# Patient Record
Sex: Male | Born: 1950 | Race: Black or African American | Hispanic: No | Marital: Single | Smoking: Former smoker
Health system: Southern US, Community
[De-identification: ages and names within clinical notes are randomized; demographics above are authoritative.]

## PROBLEM LIST (undated history)

## (undated) DIAGNOSIS — C801 Malignant (primary) neoplasm, unspecified: Secondary | ICD-10-CM

## (undated) DIAGNOSIS — R1310 Dysphagia, unspecified: Secondary | ICD-10-CM

## (undated) DIAGNOSIS — E079 Disorder of thyroid, unspecified: Secondary | ICD-10-CM

## (undated) DIAGNOSIS — E876 Hypokalemia: Secondary | ICD-10-CM

## (undated) DIAGNOSIS — I10 Essential (primary) hypertension: Secondary | ICD-10-CM

## (undated) DIAGNOSIS — K219 Gastro-esophageal reflux disease without esophagitis: Secondary | ICD-10-CM

## (undated) DIAGNOSIS — Z930 Tracheostomy status: Secondary | ICD-10-CM

## (undated) DIAGNOSIS — E871 Hypo-osmolality and hyponatremia: Secondary | ICD-10-CM

## (undated) HISTORY — PX: LEG SURGERY: SHX1003

## (undated) HISTORY — PX: TRACHEOSTOMY: SUR1362

## (undated) HISTORY — PX: THROAT SURGERY: SHX803

---

## 2006-04-08 ENCOUNTER — Ambulatory Visit: Payer: Self-pay | Admitting: Family Medicine

## 2007-04-21 ENCOUNTER — Emergency Department (HOSPITAL_COMMUNITY): Admission: EM | Admit: 2007-04-21 | Discharge: 2007-04-21 | Payer: Self-pay | Admitting: Emergency Medicine

## 2007-05-01 ENCOUNTER — Emergency Department (HOSPITAL_COMMUNITY): Admission: EM | Admit: 2007-05-01 | Discharge: 2007-05-01 | Payer: Self-pay | Admitting: Emergency Medicine

## 2010-04-28 ENCOUNTER — Emergency Department (HOSPITAL_COMMUNITY)
Admission: EM | Admit: 2010-04-28 | Discharge: 2010-04-28 | Payer: Self-pay | Source: Home / Self Care | Admitting: Emergency Medicine

## 2010-05-02 ENCOUNTER — Emergency Department (HOSPITAL_COMMUNITY)
Admission: EM | Admit: 2010-05-02 | Discharge: 2010-05-02 | Payer: Self-pay | Source: Home / Self Care | Admitting: Emergency Medicine

## 2010-07-23 LAB — COMPREHENSIVE METABOLIC PANEL
ALT: 24 U/L (ref 0–53)
Alkaline Phosphatase: 64 U/L (ref 39–117)
CO2: 22 mEq/L (ref 19–32)
Calcium: 8.9 mg/dL (ref 8.4–10.5)
GFR calc non Af Amer: >60 mL/min (ref >60)
Glucose, Bld: 89 mg/dL (ref 70–99)
Potassium: 3.3 mEq/L — ABNORMAL LOW (ref 3.5–5.1)
Sodium: 126 mEq/L — ABNORMAL LOW (ref 135–145)
Total Bilirubin: 1 mg/dL (ref 0.3–1.2)

## 2010-07-23 LAB — RAPID STREP SCREEN (MED CTR MEBANE ONLY): Streptococcus, Group A Screen (Direct): NEGATIVE

## 2010-07-23 LAB — CBC
HCT: 34.9 % — ABNORMAL LOW (ref 39.0–52.0)
Hemoglobin: 13.4 g/dL (ref 13.0–17.0)
MCHC: 38.4 g/dL — ABNORMAL HIGH (ref 30.0–36.0)

## 2010-07-23 LAB — DIFFERENTIAL
Basophils Absolute: 0 10*3/uL (ref 0.0–0.1)
Basophils Relative: 1 % (ref 0–1)
Eosinophils Absolute: 0.2 10*3/uL (ref 0.0–0.7)
Neutrophils Relative %: 55 % (ref 43–77)

## 2011-04-18 ENCOUNTER — Encounter: Payer: Self-pay | Admitting: *Deleted

## 2011-04-18 ENCOUNTER — Inpatient Hospital Stay (HOSPITAL_COMMUNITY)
Admission: EM | Admit: 2011-04-18 | Discharge: 2011-04-20 | DRG: 194 | Disposition: A | Discharge status: Nursing Home (Includes ICF) | Payer: Medicaid Other | Attending: Internal Medicine | Admitting: Internal Medicine

## 2011-04-18 ENCOUNTER — Other Ambulatory Visit: Payer: Self-pay

## 2011-04-18 ENCOUNTER — Emergency Department (HOSPITAL_COMMUNITY): Payer: Medicaid Other

## 2011-04-18 DIAGNOSIS — E871 Hypo-osmolality and hyponatremia: Secondary | ICD-10-CM | POA: Diagnosis present

## 2011-04-18 DIAGNOSIS — Z9089 Acquired absence of other organs: Secondary | ICD-10-CM

## 2011-04-18 DIAGNOSIS — I10 Essential (primary) hypertension: Secondary | ICD-10-CM | POA: Insufficient documentation

## 2011-04-18 DIAGNOSIS — J189 Pneumonia, unspecified organism: Principal | ICD-10-CM | POA: Diagnosis present

## 2011-04-18 DIAGNOSIS — R6883 Chills (without fever): Secondary | ICD-10-CM | POA: Diagnosis present

## 2011-04-18 DIAGNOSIS — Z9002 Acquired absence of larynx: Secondary | ICD-10-CM

## 2011-04-18 DIAGNOSIS — Z8521 Personal history of malignant neoplasm of larynx: Secondary | ICD-10-CM

## 2011-04-18 DIAGNOSIS — E876 Hypokalemia: Secondary | ICD-10-CM | POA: Diagnosis present

## 2011-04-18 DIAGNOSIS — K219 Gastro-esophageal reflux disease without esophagitis: Secondary | ICD-10-CM | POA: Diagnosis present

## 2011-04-18 DIAGNOSIS — C801 Malignant (primary) neoplasm, unspecified: Secondary | ICD-10-CM | POA: Insufficient documentation

## 2011-04-18 HISTORY — DX: Essential (primary) hypertension: I10

## 2011-04-18 HISTORY — DX: Malignant (primary) neoplasm, unspecified: C80.1

## 2011-04-18 HISTORY — DX: Gastro-esophageal reflux disease without esophagitis: K21.9

## 2011-04-18 LAB — URINALYSIS, ROUTINE W REFLEX MICROSCOPIC
Ketones, ur: NEGATIVE mg/dL
Leukocytes, UA: NEGATIVE
Nitrite: NEGATIVE
Specific Gravity, Urine: 1.02 (ref 1.005–1.030)
Urobilinogen, UA: 0.2 mg/dL (ref 0.0–1.0)
pH: 6 (ref 5.0–8.0)

## 2011-04-18 LAB — BASIC METABOLIC PANEL
CO2: 26 mEq/L (ref 19–32)
Calcium: 8.9 mg/dL (ref 8.4–10.5)
Chloride: 89 mEq/L — ABNORMAL LOW (ref 96–112)
GFR calc Af Amer: >90 mL/min (ref >90)
Sodium: 125 mEq/L — ABNORMAL LOW (ref 135–145)

## 2011-04-18 LAB — CBC
Platelets: 393 10*3/uL (ref 150–400)
RBC: 3.85 MIL/uL — ABNORMAL LOW (ref 4.22–5.81)
WBC: 11.4 10*3/uL — ABNORMAL HIGH (ref 4.0–10.5)

## 2011-04-18 LAB — LACTIC ACID, PLASMA: Lactic Acid, Venous: 0.6 mmol/L (ref 0.5–2.2)

## 2011-04-18 MED ORDER — LEVOTHYROXINE SODIUM 100 MCG PO TABS
100.0000 ug | ORAL_TABLET | ORAL | Status: DC
  Administered 2011-04-19: 100 ug via ORAL
  Filled 2011-04-18: qty 1

## 2011-04-18 MED ORDER — LEVOTHYROXINE SODIUM 75 MCG PO TABS
150.0000 ug | ORAL_TABLET | ORAL | Status: DC
  Administered 2011-04-20: 150 ug via ORAL
  Filled 2011-04-18: qty 2

## 2011-04-18 MED ORDER — POTASSIUM CHLORIDE IN NACL 20-0.9 MEQ/L-% IV SOLN
INTRAVENOUS | Status: AC
  Administered 2011-04-18: 23:00:00 via INTRAVENOUS

## 2011-04-18 MED ORDER — MOXIFLOXACIN HCL IN NACL 400 MG/250ML IV SOLN
400.0000 mg | INTRAVENOUS | Status: DC
  Administered 2011-04-19: 400 mg via INTRAVENOUS
  Filled 2011-04-18 (×3): qty 250

## 2011-04-18 MED ORDER — MOXIFLOXACIN HCL IN NACL 400 MG/250ML IV SOLN
400.0000 mg | Freq: Once | INTRAVENOUS | Status: AC
  Administered 2011-04-18: 400 mg via INTRAVENOUS
  Filled 2011-04-18: qty 250

## 2011-04-18 MED ORDER — PANTOPRAZOLE SODIUM 40 MG PO TBEC
40.0000 mg | DELAYED_RELEASE_TABLET | Freq: Every day | ORAL | Status: DC
  Administered 2011-04-19 – 2011-04-20 (×2): 40 mg via ORAL
  Filled 2011-04-18 (×2): qty 1

## 2011-04-18 MED ORDER — POLYETHYLENE GLYCOL 3350 17 GM/SCOOP PO POWD
17.0000 g | Freq: Every day | ORAL | Status: DC
  Administered 2011-04-19: 17 g via ORAL
  Filled 2011-04-18: qty 255

## 2011-04-18 MED ORDER — MELOXICAM 7.5 MG PO TABS
15.0000 mg | ORAL_TABLET | Freq: Every day | ORAL | Status: DC
  Administered 2011-04-19 – 2011-04-20 (×2): 15 mg via ORAL
  Filled 2011-04-18: qty 2
  Filled 2011-04-18: qty 1
  Filled 2011-04-18 (×2): qty 2

## 2011-04-18 MED ORDER — POLYETHYLENE GLYCOL 3350 17 G PO PACK
PACK | ORAL | Status: AC
  Filled 2011-04-18: qty 1

## 2011-04-18 MED ORDER — SODIUM CHLORIDE 0.9 % IV BOLUS (SEPSIS)
500.0000 mL | Freq: Once | INTRAVENOUS | Status: AC
  Administered 2011-04-18: 500 mL via INTRAVENOUS

## 2011-04-18 MED ORDER — MAGNESIUM OXIDE 400 MG PO TABS
400.0000 mg | ORAL_TABLET | Freq: Every day | ORAL | Status: DC
  Administered 2011-04-19 – 2011-04-20 (×2): 400 mg via ORAL
  Filled 2011-04-18 (×2): qty 1

## 2011-04-18 MED ORDER — DEMECLOCYCLINE HCL 150 MG PO TABS
300.0000 mg | ORAL_TABLET | Freq: Three times a day (TID) | ORAL | Status: DC
  Administered 2011-04-18 – 2011-04-20 (×5): 300 mg via ORAL
  Filled 2011-04-18 (×11): qty 2

## 2011-04-18 MED ORDER — FOLIC ACID 1 MG PO TABS
1.0000 mg | ORAL_TABLET | Freq: Every day | ORAL | Status: DC
  Administered 2011-04-19 – 2011-04-20 (×2): 1 mg via ORAL
  Filled 2011-04-18 (×2): qty 1

## 2011-04-18 MED ORDER — VITAMIN C 500 MG PO TABS
1000.0000 mg | ORAL_TABLET | Freq: Two times a day (BID) | ORAL | Status: DC
  Administered 2011-04-18 – 2011-04-20 (×4): 1000 mg via ORAL
  Filled 2011-04-18 (×4): qty 2

## 2011-04-18 MED ORDER — DEMECLOCYCLINE HCL 150 MG PO TABS
ORAL_TABLET | ORAL | Status: AC
  Filled 2011-04-18: qty 2

## 2011-04-18 MED ORDER — FLUDROCORTISONE ACETATE 0.1 MG PO TABS
0.1000 mg | ORAL_TABLET | Freq: Every day | ORAL | Status: DC
  Administered 2011-04-19 – 2011-04-20 (×2): 0.1 mg via ORAL
  Filled 2011-04-18 (×4): qty 1

## 2011-04-18 MED ORDER — FERROUS SULFATE 300 (60 FE) MG/5ML PO SYRP
ORAL_SOLUTION | ORAL | Status: AC
  Filled 2011-04-18: qty 5

## 2011-04-18 MED ORDER — VITAMIN B-1 100 MG PO TABS
50.0000 mg | ORAL_TABLET | Freq: Every day | ORAL | Status: DC
  Administered 2011-04-19 – 2011-04-20 (×2): 50 mg via ORAL
  Filled 2011-04-18 (×2): qty 1

## 2011-04-18 MED ORDER — BISACODYL 5 MG PO TBEC
10.0000 mg | DELAYED_RELEASE_TABLET | Freq: Every day | ORAL | Status: DC
  Administered 2011-04-19 – 2011-04-20 (×2): 10 mg via ORAL
  Filled 2011-04-18 (×2): qty 2

## 2011-04-18 MED ORDER — FERROUS SULFATE 300 (60 FE) MG/5ML PO SYRP
300.0000 mg | ORAL_SOLUTION | Freq: Three times a day (TID) | ORAL | Status: DC
  Administered 2011-04-18 – 2011-04-20 (×5): 300 mg via ORAL
  Filled 2011-04-18 (×11): qty 5

## 2011-04-18 NOTE — ED Provider Notes (Signed)
History     CSN: [id number] Arrival date & time: 04/18/2011  4:06 PM   First MD Initiated Contact with Patient 04/18/11 1610      Chief Complaint  Patient presents with  . Weakness   HPI Pt is a 60 year old man, resident of a facility and s/p laryngectomy for laryngeal/esophageal CA who presents with ~1 month of weakness, fatigue, decreased appetite, weight loss, and 5 days of fevers, chills, and occasional body aches.  History is somewhat limited as patient does not have voice projector with him today.  Pt has had about 1 month of gradual decline and was seen by his PCP several times.  He has had some lab work done and, reportedly, had elevated PSA.  Has an appointment with urologist next week.  Workup thus far has been, reportedly, otherwise unremarkable.  Pt requested to be taken to the emergency department today due to his chills.  Reports some subjective fevers, although there are no documented fevers from facility.  Otherwise, patient does not have any focal complaints.  Past Medical History  Diagnosis Date  . Cancer   . Hypertension   . GERD (gastroesophageal reflux disease)     Past Surgical History  Procedure Date  . Throat surgery     for cancer  . Leg surgery     History reviewed. No pertinent family history.  History  Substance Use Topics  . Smoking status: Former Games developer  . Smokeless tobacco: Not on file  . Alcohol Use: No    Review of Systems  Constitutional: Positive for fever, chills, activity change, appetite change, fatigue and unexpected weight change.  HENT: Negative.   Eyes: Negative.   Respiratory: Negative.   Cardiovascular: Negative.   Gastrointestinal: Negative.   Genitourinary: Negative.   Musculoskeletal: Positive for myalgias. Negative for back pain, joint swelling and arthralgias.  Neurological: Positive for weakness. Negative for dizziness, syncope, numbness and headaches.  Hematological: Negative.     Allergies  Review of patient's  allergies indicates no known allergies.  Home Medications   Current Outpatient Rx  Name Route Sig Dispense Refill  . ACETAMINOPHEN 160 MG/5ML PO SUSP Oral Take 998.4 mg by mouth 4 (four) times daily. Give 31.34ml by mouth four times daily     . BISACODYL 5 MG PO TBEC Oral Take 10 mg by mouth daily.      Marland Kitchen VITAMIN D 2000 UNITS PO CAPS Oral Take 1 capsule by mouth daily.      . DEMECLOCYCLINE HCL 300 MG PO TABS Oral Take 300 mg by mouth 3 (three) times daily.      Marland Kitchen FERROUS SULFATE 220 (44 FE) MG/5ML PO ELIX Oral Take 330 mg by mouth 3 (three) times daily. Take 7..5 ml by mouth three times daily     . FLUDROCORTISONE ACETATE 0.1 MG PO TABS Oral Take 0.1 mg by mouth daily.      Marland Kitchen FOLIC ACID 1 MG PO TABS Oral Take 1 mg by mouth daily.      Marland Kitchen LEVOTHYROXINE SODIUM 100 MCG PO TABS Oral Take 100-150 mcg by mouth as directed. Take one & one-half tablet by mouth on Saturdays and Sundays, then take one tablet on Mondays through Friday     . MAGNESIUM OXIDE 400 MG PO TABS Oral Take 400 mg by mouth daily.      . MELOXICAM 15 MG PO TABS Oral Take 15 mg by mouth daily.      . THERAVITE PO Oral Take 1 tablet  by mouth daily.      Marland Kitchen OMEPRAZOLE 20 MG PO CPDR Oral Take 20 mg by mouth daily.      Marland Kitchen POLYETHYLENE GLYCOL 3350 PO POWD Oral Take 17 g by mouth daily.      Marland Kitchen POTASSIUM CHLORIDE 20 MEQ/15ML (10%) PO SOLN Oral Take 20 mEq by mouth daily. *Dilute one tablespoon (15ml) in 3 ounces of water and drink daily*     . VITAMIN B-1 100 MG PO TABS Oral Take 50 mg by mouth daily.      . TRIAMCINOLONE ACETONIDE 0.1 % EX CREA Topical Apply 1 application topically daily. To affected areas     . VITAMIN C 500 MG PO TABS Oral Take 1,000 mg by mouth 2 (two) times daily.        BP 110/72  Pulse 66  Temp(Src) 99.4 F (37.4 C) (Rectal)  Resp 16  Ht 6\' 2"  (1.88 m)  Wt 172 lb (78.019 kg)  BMI 22.08 kg/m2  SpO2 97%  Physical Exam  Constitutional: He is oriented to person, place, and time.       Chronically ill  appearing individual, cachetic, no acute distress  HENT:  Head: Normocephalic and atraumatic.  Mouth/Throat: Oropharynx is clear and moist.       Black tongue  Eyes: Conjunctivae and EOM are normal. Pupils are equal, round, and reactive to light.  Neck: Normal range of motion. Neck supple.       S/p laryngectomy.  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   Pulmonary/Chest: Effort normal and breath sounds normal. No respiratory distress. He has no wheezes. He has no rales.  Abdominal: Soft. He exhibits no distension and no mass. There is no tenderness. There is no guarding.       Mildly decreased bowel sounds  Musculoskeletal: Normal range of motion. He exhibits no edema and no tenderness.       Pt is able to stand and walk.  Strength is 5+ and symmetric bilaterally  Neurological: He is alert and oriented to person, place, and time. No cranial nerve deficit. He exhibits normal muscle tone. Coordination normal.  Skin: Skin is warm and dry.    ED Course  Procedures (including critical care time)  Labs Reviewed  CBC - Abnormal; Notable for the following:    WBC 11.4 (*)    RBC 3.85 (*)    Hemoglobin 12.1 (*)    HCT 32.9 (*)    MCHC 36.8 (*)    All other components within normal limits  BASIC METABOLIC PANEL - Abnormal; Notable for the following:    Sodium 125 (*)    Potassium 3.3 (*)    Chloride 89 (*)    Glucose, Bld 103 (*)    All other components within normal limits  LACTIC ACID, PLASMA  URINALYSIS, ROUTINE W REFLEX MICROSCOPIC  CULTURE, BLOOD (ROUTINE X 2)  CULTURE, BLOOD (ROUTINE X 2)   Dg Chest Portable 1 View  04/18/2011  *RADIOLOGY REPORT*  Clinical Data: Weakness, fever, chills.  PORTABLE CHEST - 1 VIEW  Comparison: 04/28/2010  Findings: Right Port-A-Cath remains in place, unchanged.  Heart is normal size.  Patchy opacities in the left lung and right lung base.  Cannot exclude pneumonia.  No  effusions.  No acute bony abnormality.  IMPRESSION: Patchy left lung and right basilar opacities, question pneumonia or bronchopneumonia.  Original Report Authenticated By: Cyndie Chime, M.D.  No diagnosis found.   Date: 04/18/2011  Rate: 70  Rhythm: normal sinus rhythm  QRS Axis: normal  Intervals: normal  ST/T Wave abnormalities: normal  Conduction Disutrbances:none  Narrative Interpretation: Normal EKG  Old EKG Reviewed: none available    MDM  60 year old male, nursing home resident, with weakness, fevers, chills, and decreased PO intake now with bilateral, multifocal PNA, hyponatremia, and hypokalemia.  Will request admission and give initial dose of Avelox.        Majel Homer, MD 04/18/11 [phone]

## 2011-04-18 NOTE — ED Provider Notes (Signed)
Pt seen with resident Pt with gen. Weakness He is awake/alert Will need admission due to likely healthcare associated pneumonia  Joya Gaskins, MD 04/18/11 1718

## 2011-04-18 NOTE — ED Notes (Signed)
Pt c/o weakness and nausea since last week. States that he can't eat. Seen by MD last week and had labs drawn. Pt has an elevated PSA per caregiver and has an appointment with a urologist next Thursday. Caregiver states that he has gotten worse and has lost weight.

## 2011-04-18 NOTE — H&P (Signed)
PCP:   Colon Branch, MD   Chief Complaint:  Chills body aches  HPI: 60 year old male with Wilcox history of laryngeal cancer status post laryngectomy who lives at assisted living who comes in with several days of weakness subjective fevers and chills. He is Wilcox difficult historian due to his history of reconstructive surgery to his larynx and communicates mostly 3 yes no answers and writing on his tablet.  He denies any pain denies any shortness of breath has been having Wilcox cough feels Wilcox little better with IV fluids in the ED and Avelox. Denies any nausea vomiting or diarrhea.  Review of Systems:  Otherwise negative  Past Medical History: Past Medical History  Diagnosis Date  . Cancer   . Hypertension   . GERD (gastroesophageal reflux disease)    Past Surgical History  Procedure Date  . Throat surgery     for cancer  . Leg surgery     Medications: Prior to Admission medications   Medication Sig Start Date End Date Taking? Authorizing Provider  acetaminophen (TYLENOL) 160 MG/5ML suspension Take 998.4 mg by mouth 4 (four) times daily. Give 31.59ml by mouth four times daily    Yes Historical Provider, MD  bisacodyl (DULCOLAX) 5 MG EC tablet Take 10 mg by mouth daily.     Yes Historical Provider, MD  Cholecalciferol (VITAMIN D) 2000 UNITS CAPS Take 1 capsule by mouth daily.     Yes Historical Provider, MD  demeclocycline (DECLOMYCIN) 300 MG tablet Take 300 mg by mouth 3 (three) times daily.     Yes Historical Provider, MD  ferrous sulfate 220 (44 FE) MG/5ML solution Take 330 mg by mouth 3 (three) times daily. Take 7..5 ml by mouth three times daily    Yes Historical Provider, MD  fludrocortisone (FLORINEF) 0.1 MG tablet Take 0.1 mg by mouth daily.     Yes Historical Provider, MD  folic acid (FOLVITE) 1 MG tablet Take 1 mg by mouth daily.     Yes Historical Provider, MD  levothyroxine (SYNTHROID, LEVOTHROID) 100 MCG tablet Take 100-150 mcg by mouth as directed. Take one & one-half tablet by  mouth on Saturdays and Sundays, then take one tablet on Mondays through Friday    Yes Historical Provider, MD  magnesium oxide (MAG-OX) 400 MG tablet Take 400 mg by mouth daily.     Yes Historical Provider, MD  meloxicam (MOBIC) 15 MG tablet Take 15 mg by mouth daily.     Yes Historical Provider, MD  Multiple Vitamin (THERAVITE PO) Take 1 tablet by mouth daily.     Yes Historical Provider, MD  omeprazole (PRILOSEC) 20 MG capsule Take 20 mg by mouth daily.     Yes Historical Provider, MD  polyethylene glycol powder (GLYCOLAX/MIRALAX) powder Take 17 g by mouth daily.     Yes Historical Provider, MD  potassium chloride 20 MEQ/15ML (10%) SOLN Take 20 mEq by mouth daily. *Dilute one tablespoon (15ml) in 3 ounces of water and drink daily*    Yes Historical Provider, MD  thiamine (VITAMIN B-1) 100 MG tablet Take 50 mg by mouth daily.     Yes Historical Provider, MD  triamcinolone cream (KENALOG) 0.1 % Apply 1 application topically daily. To affected areas    Yes Historical Provider, MD  vitamin C (ASCORBIC ACID) 500 MG tablet Take 1,000 mg by mouth 2 (two) times daily.     Yes Historical Provider, MD    Allergies:  No Known Allergies  Social History:  reports that he has  quit smoking. He does not have any smokeless tobacco history on file. He reports that he does not drink alcohol or use illicit drugs.  Family History: History reviewed. No pertinent family history.  Physical Exam: Filed Vitals:   04/18/11 1815 04/18/11 1830 04/18/11 1900 04/18/11 2031  BP:   116/65 153/82  Pulse: 63 63 67 70  Temp:    98.5 F (36.9 C)  TempSrc:    Oral  Resp: 26 27 19 20   Height:    5\' 10"  (1.778 m)  Weight:    75.1 kg (165 lb 9.1 oz)  SpO2: 98% 100% 99% 100%   General appearance: alert, cooperative and no distress Resp: clear to auscultation bilaterally Cardio: regular rate and rhythm, S1, S2 normal, no murmur, click, rub or gallop GI: soft, non-tender; bowel sounds normal; no masses,  no  organomegaly Extremities: extremities normal, atraumatic, no cyanosis or edema Pulses: 2+ and symmetric Skin: Skin color, texture, turgor normal. No rashes or lesions Neurologic: Grossly normal   Labs on Admission:   [facility] 04/18/11 1725  NA 125*  K 3.3*  CL 89*  CO2 26  GLUCOSE 103*  BUN 11  CREATININE 0.64  CALCIUM 8.9  MG --  PHOS --    Basename 04/18/11 1725  WBC 11.4*  NEUTROABS --  HGB 12.1*  HCT 32.9*  MCV 85.5  PLT 393   Radiological Exams on Admission: Dg Chest Portable 1 View  04/18/2011  *RADIOLOGY REPORT*  Clinical Data: Weakness, fever, chills.  PORTABLE CHEST - 1 VIEW  Comparison: 04/28/2010  Findings: Right Port-Wilcox-Cath remains in place, unchanged.  Heart is normal size.  Patchy opacities in the left lung and right lung base.  Cannot exclude pneumonia.  No effusions.  No acute bony abnormality.  IMPRESSION: Patchy left lung and right basilar opacities, question pneumonia or bronchopneumonia.  Original Report Authenticated By: Cyndie Chime, M.D.    Assessment/Plan Present on Admission:  60 year old male found to have bilateral pneumonia without any evidence of respiratory distress  .Chills (without fever) secondary to pneumonia.  Marland KitchenPNA (pneumonia) Place on Avelox blood cultures and sputum cultures have been ordered.  Marland KitchenHyponatremia IV fluids secondary dehydration mild.  .Hypokalemia replace in IV fluids History of laryngeal cancer has completed therapy over Wilcox year ago. Due to his significant history of head and neck cancer on going to obtain Wilcox swallow evaluation to make sure he is not high risk for aspirating.  Justin Wilcox 04/18/2011, 9:00 PM

## 2011-04-18 NOTE — ED Notes (Signed)
Pt resting quietly on stretcher. C/o headache. Coughing at intervals. IV infusing via port right chest with no edema or redness.

## 2011-04-19 LAB — EXPECTORATED SPUTUM ASSESSMENT W GRAM STAIN, RFLX TO RESP C

## 2011-04-19 LAB — DIFFERENTIAL
Eosinophils Relative: 4 % (ref 0–5)
Lymphocytes Relative: 19 % (ref 12–46)
Lymphs Abs: 2.1 10*3/uL (ref 0.7–4.0)
Neutro Abs: 7.2 10*3/uL (ref 1.7–7.7)

## 2011-04-19 LAB — CBC
HCT: 32.3 % — ABNORMAL LOW (ref 39.0–52.0)
MCV: 85.9 fL (ref 78.0–100.0)
Platelets: 425 10*3/uL — ABNORMAL HIGH (ref 150–400)
RBC: 3.76 MIL/uL — ABNORMAL LOW (ref 4.22–5.81)
WBC: 11 10*3/uL — ABNORMAL HIGH (ref 4.0–10.5)

## 2011-04-19 LAB — BASIC METABOLIC PANEL
CO2: 25 mEq/L (ref 19–32)
Calcium: 8.6 mg/dL (ref 8.4–10.5)
Chloride: 92 mEq/L — ABNORMAL LOW (ref 96–112)
GFR calc Af Amer: >90 mL/min (ref >90)
Sodium: 127 mEq/L — ABNORMAL LOW (ref 135–145)

## 2011-04-19 MED ORDER — ALBUTEROL SULFATE (5 MG/ML) 0.5% IN NEBU
2.5000 mg | INHALATION_SOLUTION | Freq: Four times a day (QID) | RESPIRATORY_TRACT | Status: DC
  Administered 2011-04-19 – 2011-04-20 (×4): 2.5 mg via RESPIRATORY_TRACT
  Filled 2011-04-19 (×4): qty 0.5

## 2011-04-19 MED ORDER — SODIUM CHLORIDE 0.9 % IV SOLN
INTRAVENOUS | Status: DC
  Administered 2011-04-19: 20 mL via INTRAVENOUS

## 2011-04-19 MED ORDER — SODIUM CHLORIDE 0.9 % IJ SOLN
INTRAMUSCULAR | Status: AC
  Filled 2011-04-19: qty 3

## 2011-04-19 MED ORDER — SODIUM CHLORIDE 0.9 % IJ SOLN
INTRAMUSCULAR | Status: AC
  Filled 2011-04-19: qty 6

## 2011-04-19 MED ORDER — GUAIFENESIN ER 600 MG PO TB12: 600.0000 mg | ORAL_TABLET | Freq: Two times a day (BID) | ORAL | Status: DC

## 2011-04-19 MED ORDER — IPRATROPIUM BROMIDE 0.02 % IN SOLN
0.5000 mg | Freq: Four times a day (QID) | RESPIRATORY_TRACT | Status: DC
  Administered 2011-04-19 – 2011-04-20 (×4): 0.5 mg via RESPIRATORY_TRACT
  Filled 2011-04-19 (×4): qty 2.5

## 2011-04-19 MED ORDER — GUAIFENESIN 100 MG/5ML PO SOLN
10.0000 mL | Freq: Four times a day (QID) | ORAL | Status: DC
  Administered 2011-04-19 – 2011-04-20 (×4): 200 mg via ORAL
  Filled 2011-04-19 (×4): qty 10

## 2011-04-19 NOTE — Progress Notes (Signed)
Subjective: Denies any shortness of breath. He is coughing which appears to be somewhat nonproductive  Objective:  Vital signs in last 24 hours:  Filed Vitals:   04/18/11 1900 04/18/11 2031 04/19/11 0319 04/19/11 0649  BP: 116/65 153/82  156/93  Pulse: 67 70  81  Temp:  98.5 F (36.9 C)  98.2 F (36.8 C)  TempSrc:  Oral  Oral  Resp: 19 20  20   Height:  5\' 10"  (1.778 m)    Weight:  75.1 kg (165 lb 9.1 oz)    SpO2: 99% 100% 98% 100%    Intake/Output from previous day:   Intake/Output Summary (Last 24 hours) at 04/19/11 1146 Last data filed at 04/19/11 0945  Gross per 24 hour  Intake 499.41 ml  Output    750 ml  Net -250.59 ml    Physical Exam: General: Alert, awake, oriented x3, in no acute distress. HEENT: No bruits, no goiter. Moist mucous membranes, no scleral icterus, no conjunctival pallor. Heart: Regular rate and rhythm, without murmurs, rubs, gallops. Lungs: Diffuse expiratory wheeze  Abdomen: Soft, nontender, nondistended, positive bowel sounds. Extremities: No clubbing cyanosis or edema,  positive pedal pulses. Neuro: Grossly intact, nonfocal.    Lab Results:  Basic Metabolic Panel:    Component Value Date/Time   NA 127* 04/19/2011 0513   K 3.7 04/19/2011 0513   CL 92* 04/19/2011 0513   CO2 25 04/19/2011 0513   BUN 9 04/19/2011 0513   CREATININE 0.65 04/19/2011 0513   GLUCOSE 91 04/19/2011 0513   CALCIUM 8.6 04/19/2011 0513   CBC:    Component Value Date/Time   WBC 11.0* 04/19/2011 0513   HGB 11.9* 04/19/2011 0513   HCT 32.3* 04/19/2011 0513   PLT 425* 04/19/2011 0513   MCV 85.9 04/19/2011 0513   NEUTROABS 7.2 04/19/2011 0513   LYMPHSABS 2.1 04/19/2011 0513   MONOABS 1.2* 04/19/2011 0513   EOSABS 0.5 04/19/2011 0513   BASOSABS 0.1 04/19/2011 0513      Lab 04/19/11 0513 04/18/11 1725  WBC 11.0* 11.4*  HGB 11.9* 12.1*  HCT 32.3* 32.9*  PLT 425* 393  MCV 85.9 85.5  MCH 31.6 31.4  MCHC 36.8* 36.8*  RDW 12.9 12.6  LYMPHSABS 2.1 --  MONOABS 1.2* --    EOSABS 0.5 --  BASOSABS 0.1 --  BANDABS -- --    Lab 04/19/11 0513 04/18/11 1725  NA 127* 125*  K 3.7 3.3*  CL 92* 89*  CO2 25 26  GLUCOSE 91 103*  BUN 9 11  CREATININE 0.65 0.64  CALCIUM 8.6 8.9  MG -- --   No results found for this basename: INR:5,PROTIME:5 in the last 168 hours Cardiac markers: No results found for this basename: CK:3,CKMB:3,TROPONINI:3,MYOGLOBIN:3 in the last 168 hours No results found for this basename: POCBNP:3 in the last 168 hours Recent Results (from the past 240 hour(s))  CULTURE, SPUTUM-ASSESSMENT     Status: Normal   Collection Time   04/19/11 12:47 AM      Component Value Range Status Comment   Specimen Description SPUTUM   Final    Special Requests NONE   Final    Sputum evaluation     Final    Value: MICROSCOPIC FINDINGS SUGGEST THAT THIS SPECIMEN IS NOT REPRESENTATIVE OF LOWER RESPIRATORY SECRETIONS. PLEASE RECOLLECT. Gram Stain Report Called to,Read Back By and Verified With: STEVENSON J ON 04/19/2011 AT 0151 BY MILES A   Report Status 04/19/2011 FINAL   Final     Studies/Results: Dg  Chest Portable 1 View  04/18/2011  *RADIOLOGY REPORT*  Clinical Data: Weakness, fever, chills.  PORTABLE CHEST - 1 VIEW  Comparison: 04/28/2010  Findings: Right Port-A-Cath remains in place, unchanged.  Heart is normal size.  Patchy opacities in the left lung and right lung base.  Cannot exclude pneumonia.  No effusions.  No acute bony abnormality.  IMPRESSION: Patchy left lung and right basilar opacities, question pneumonia or bronchopneumonia.  Original Report Authenticated By: Cyndie Chime, M.D.    Medications: Scheduled Meds:   . bisacodyl  10 mg Oral Daily  . demeclocycline  300 mg Oral TID  . ferrous sulfate  300 mg Oral TID  . fludrocortisone  0.1 mg Oral Daily  . folic acid  1 mg Oral Daily  . levothyroxine  100 mcg Oral Custom  . levothyroxine  150 mcg Oral Custom  . magnesium oxide  400 mg Oral Daily  . meloxicam  15 mg Oral Daily  .  moxifloxacin  400 mg Intravenous Once  . moxifloxacin  400 mg Intravenous Q24H  . pantoprazole  40 mg Oral Q1200  . polyethylene glycol powder  17 g Oral Daily  . sodium chloride  500 mL Intravenous Once  . sodium chloride      . sodium chloride      . thiamine  50 mg Oral Daily  . vitamin C  1,000 mg Oral BID   Continuous Infusions:   . sodium chloride    . 0.9 % NaCl with KCl 20 mEq / L 75 mL/hr at 04/18/11 2256   PRN Meds:.  Assessment/Plan:  Principal Problem:  *PNA (pneumonia) Active Problems:  Cancer  Hypertension  Chills (without fever)  Hyponatremia  Hypokalemia  S/P laryngectomy  Plan:  Patient is on IV antibiotics with Avelox for pneumonia. He has remained afebrile at this time. He does not seem too far from his baseline in terms of his respiratory status. We'll add Mucinex as well as nebulizer treatments to assist in pulmonary hygiene. Currently await a swallow evaluation to rule out any aspiration which could be contributing to his pneumonia. Patient is hyponatremia. This appears to be chronic in nature as his sodium was in the same range approximately one year ago. We will discontinue his IV fluids as he does not appear to be volume depleted. He reports that he is able to eat and drink by mouth. His hyperkalemia appears to have improved. He will be discharged back to assisted-living facility once his acute issues have resolved.   LOS: 1 day   Rakisha Pincock 04/19/2011, 11:46 AM

## 2011-04-19 NOTE — Progress Notes (Signed)
UR Chart Review Completed  

## 2011-04-20 LAB — CBC
HCT: 30.3 % — ABNORMAL LOW (ref 39.0–52.0)
Hemoglobin: 11 g/dL — ABNORMAL LOW (ref 13.0–17.0)
MCV: 85.1 fL (ref 78.0–100.0)
RBC: 3.56 MIL/uL — ABNORMAL LOW (ref 4.22–5.81)
WBC: 9.4 10*3/uL (ref 4.0–10.5)

## 2011-04-20 LAB — BASIC METABOLIC PANEL
CO2: 26 mEq/L (ref 19–32)
Chloride: 91 mEq/L — ABNORMAL LOW (ref 96–112)
Creatinine, Ser: 0.66 mg/dL (ref 0.50–1.35)
GFR calc Af Amer: >90 mL/min (ref >90)
Potassium: 3.6 mEq/L (ref 3.5–5.1)
Sodium: 125 mEq/L — ABNORMAL LOW (ref 135–145)

## 2011-04-20 LAB — STREP PNEUMONIAE URINARY ANTIGEN: Strep Pneumo Urinary Antigen: NEGATIVE

## 2011-04-20 MED ORDER — ACETAMINOPHEN 325 MG PO TABS
650.0000 mg | ORAL_TABLET | Freq: Four times a day (QID) | ORAL | Status: DC | PRN
  Administered 2011-04-20: 650 mg via ORAL
  Filled 2011-04-20: qty 2

## 2011-04-20 MED ORDER — MOXIFLOXACIN HCL 400 MG PO TABS: 400.0000 mg | ORAL_TABLET | Freq: Every day | ORAL | Status: AC

## 2011-04-20 MED ORDER — POLYETHYLENE GLYCOL 3350 17 G PO PACK
17.0000 g | PACK | Freq: Every day | ORAL | Status: DC
  Administered 2011-04-20: 17 g via ORAL
  Filled 2011-04-20: qty 1

## 2011-04-20 MED ORDER — SODIUM CHLORIDE 0.9 % IJ SOLN
INTRAMUSCULAR | Status: AC
  Administered 2011-04-20: 06:00:00
  Filled 2011-04-20: qty 3

## 2011-04-20 MED ORDER — GUAIFENESIN 100 MG/5ML PO SOLN: 10.0000 mL | Freq: Four times a day (QID) | ORAL | Status: DC

## 2011-04-20 NOTE — Discharge Summary (Signed)
Patient ID: Justin Wilcox MRN: [id number] DOB/AGE: 1950-08-10 60 y.o.  Admit date: 04/18/2011 Discharge date: 04/20/2011  Primary Care Physician:  Colon Branch, MD  Discharge Diagnoses:    Present on Admission:   Principal Problem:  *PNA (pneumonia) Active Problems:  Cancer  Hypertension  Chills (without fever)  Hyponatremia, chronic  Hypokalemia  S/P laryngectomy   Current Discharge Medication List    START taking these medications   Details  guaiFENesin (ROBITUSSIN) 100 MG/5ML SOLN Take 10 mLs (200 mg total) by mouth 4 (four) times daily. Qty: 1200 mL    moxifloxacin (AVELOX) 400 MG tablet Take 1 tablet (400 mg total) by mouth daily.      CONTINUE these medications which have NOT CHANGED   Details  acetaminophen (TYLENOL) 160 MG/5ML suspension Take 998.4 mg by mouth 4 (four) times daily. Give 31.58ml by mouth four times daily     bisacodyl (DULCOLAX) 5 MG EC tablet Take 10 mg by mouth daily.      Cholecalciferol (VITAMIN D) 2000 UNITS CAPS Take 1 capsule by mouth daily.      demeclocycline (DECLOMYCIN) 300 MG tablet Take 300 mg by mouth 3 (three) times daily.      ferrous sulfate 220 (44 FE) MG/5ML solution Take 330 mg by mouth 3 (three) times daily. Take 7..5 ml by mouth three times daily     fludrocortisone (FLORINEF) 0.1 MG tablet Take 0.1 mg by mouth daily.      folic acid (FOLVITE) 1 MG tablet Take 1 mg by mouth daily.      levothyroxine (SYNTHROID, LEVOTHROID) 100 MCG tablet Take 100-150 mcg by mouth as directed. Take one & one-half tablet by mouth on Saturdays and Sundays, then take one tablet on Mondays through Friday     magnesium oxide (MAG-OX) 400 MG tablet Take 400 mg by mouth daily.      meloxicam (MOBIC) 15 MG tablet Take 15 mg by mouth daily.      Multiple Vitamin (THERAVITE PO) Take 1 tablet by mouth daily.      omeprazole (PRILOSEC) 20 MG capsule Take 20 mg by mouth daily.      polyethylene glycol powder (GLYCOLAX/MIRALAX) powder Take 17 g  by mouth daily.      potassium chloride 20 MEQ/15ML (10%) SOLN Take 20 mEq by mouth daily. *Dilute one tablespoon (15ml) in 3 ounces of water and drink daily*     thiamine (VITAMIN B-1) 100 MG tablet Take 50 mg by mouth daily.      triamcinolone cream (KENALOG) 0.1 % Apply 1 application topically daily. To affected areas     vitamin C (ASCORBIC ACID) 500 MG tablet Take 1,000 mg by mouth 2 (two) times daily.          Disposition and Follow-up: follow up with primary doctor in 1 week, will need outpatient swallow evaluation  Consults:  none  Significant Diagnostic Studies:  Dg Chest Portable 1 View  04/18/2011  *RADIOLOGY REPORT*  Clinical Data: Weakness, fever, chills.  PORTABLE CHEST - 1 VIEW  Comparison: 04/28/2010  Findings: Right Port-A-Cath remains in place, unchanged.  Heart is normal size.  Patchy opacities in the left lung and right lung base.  Cannot exclude pneumonia.  No effusions.  No acute bony abnormality.  IMPRESSION: Patchy left lung and right basilar opacities, question pneumonia or bronchopneumonia.  Original Report Authenticated By: Cyndie Chime, M.D.    Brief H and P: For complete details please refer to admission H and P, but in brief  60 year old male with a history of laryngeal cancer status post laryngectomy who lives at assisted living who comes in with several days of weakness subjective fevers and chills. He is a difficult historian due to his history of reconstructive surgery to his larynx and communicates mostly 3 yes no answers and writing on his tablet. He denies any pain denies any shortness of breath has been having a cough feels a little better with IV fluids in the ED and Avelox. Denies any nausea vomiting or diarrhea.   Physical Exam on Discharge:  Filed Vitals:   04/19/11 2221 04/20/11 0323 04/20/11 0541 04/20/11 0758  BP:   129/78   Pulse:   71   Temp:   98.9 F (37.2 C)   TempSrc:      Resp:   16   Height:      Weight:      SpO2: 98% 96%  96% 100%     Intake/Output Summary (Last 24 hours) at 04/20/11 1108 Last data filed at 04/20/11 0920  Gross per 24 hour  Intake 1622.83 ml  Output   1325 ml  Net 297.83 ml    General: Alert, awake, oriented x3, in no acute distress. HEENT: No bruits, no goiter. Heart: Regular rate and rhythm, without murmurs, rubs, gallops. Lungs: Clear to auscultation bilaterally. Abdomen: Soft, nontender, nondistended, positive bowel sounds. Extremities: No clubbing cyanosis or edema with positive pedal pulses. Neuro: Grossly intact, nonfocal.  CBC:    Component Value Date/Time   WBC 9.4 04/20/2011 0559   HGB 11.0* 04/20/2011 0559   HCT 30.3* 04/20/2011 0559   PLT 394 04/20/2011 0559   MCV 85.1 04/20/2011 0559   NEUTROABS 7.2 04/19/2011 0513   LYMPHSABS 2.1 04/19/2011 0513   MONOABS 1.2* 04/19/2011 0513   EOSABS 0.5 04/19/2011 0513   BASOSABS 0.1 04/19/2011 0513    Basic Metabolic Panel:    Component Value Date/Time   NA 125* 04/20/2011 0559   K 3.6 04/20/2011 0559   CL 91* 04/20/2011 0559   CO2 26 04/20/2011 0559   BUN 9 04/20/2011 0559   CREATININE 0.66 04/20/2011 0559   GLUCOSE 89 04/20/2011 0559   CALCIUM 8.7 04/20/2011 0559    Hospital Course:  Principal Problem:  *PNA (pneumonia), Patient was started empirically on avelox.  He has remained a febrile here in the hospital. He has a minimal cough and no respiratory compromise.  He will complete a total of 7 days of antibiotics.  He would benefit from a swallow evaluation, but this can be done on an outpatient basis. Active Problems:  Cancer  Hypertension, stable  Chills (without fever)  Hyponatremia, chronic in nature, asymptomatic, at baseline, to be followed as outpatient  Hypokalemia, resolved.  S/P laryngectomy   Time spent on Discharge Signed: MEMON,JEHANZEB 04/20/2011, 11:08 AM

## 2011-04-20 NOTE — Progress Notes (Signed)
Pt discharged back to [facility] per Dr. Kerry Hough. Pt's VS stable at this time. Pt's Port De-accessed and WNL. Accompanying ASL personnel provided with home medication list and discharge instructions. Verbalized understanding. Made aware that pt would need to make F/U appointment with PCP on Monday. Verbalized understanding. Pt left floor via WC in stable condition accompanied by NT. Dagoberto Ligas, RN

## 2011-04-20 NOTE — Progress Notes (Signed)
Pt to D/C today to [facility].  CSW spoke with staff at facility who agreed to D/C plan and agreed transport Pt. CSW will sign off at this time.

## 2011-04-23 LAB — CULTURE, BLOOD (ROUTINE X 2)
Culture: NO GROWTH
Culture: NO GROWTH

## 2011-05-12 ENCOUNTER — Emergency Department (HOSPITAL_COMMUNITY): Payer: Medicaid Other

## 2011-05-12 ENCOUNTER — Emergency Department (HOSPITAL_COMMUNITY)
Admission: EM | Admit: 2011-05-12 | Discharge: 2011-05-12 | Disposition: A | Discharge status: Home / Self Care | Payer: Medicaid Other | Attending: Emergency Medicine | Admitting: Emergency Medicine

## 2011-05-12 ENCOUNTER — Encounter (HOSPITAL_COMMUNITY): Payer: Self-pay | Admitting: Emergency Medicine

## 2011-05-12 DIAGNOSIS — R0602 Shortness of breath: Secondary | ICD-10-CM | POA: Insufficient documentation

## 2011-05-12 DIAGNOSIS — Z930 Tracheostomy status: Secondary | ICD-10-CM | POA: Insufficient documentation

## 2011-05-12 DIAGNOSIS — Z79899 Other long term (current) drug therapy: Secondary | ICD-10-CM | POA: Insufficient documentation

## 2011-05-12 DIAGNOSIS — Z9889 Other specified postprocedural states: Secondary | ICD-10-CM | POA: Insufficient documentation

## 2011-05-12 DIAGNOSIS — I10 Essential (primary) hypertension: Secondary | ICD-10-CM | POA: Insufficient documentation

## 2011-05-12 DIAGNOSIS — Z859 Personal history of malignant neoplasm, unspecified: Secondary | ICD-10-CM | POA: Insufficient documentation

## 2011-05-12 DIAGNOSIS — K219 Gastro-esophageal reflux disease without esophagitis: Secondary | ICD-10-CM | POA: Insufficient documentation

## 2011-05-12 DIAGNOSIS — Z760 Encounter for issue of repeat prescription: Secondary | ICD-10-CM

## 2011-05-12 DIAGNOSIS — E079 Disorder of thyroid, unspecified: Secondary | ICD-10-CM | POA: Insufficient documentation

## 2011-05-12 DIAGNOSIS — R05 Cough: Secondary | ICD-10-CM | POA: Insufficient documentation

## 2011-05-12 DIAGNOSIS — R059 Cough, unspecified: Secondary | ICD-10-CM | POA: Insufficient documentation

## 2011-05-12 HISTORY — DX: Disorder of thyroid, unspecified: E07.9

## 2011-05-12 MED ORDER — SODIUM CHLORIDE 0.9 % IN NEBU
INHALATION_SOLUTION | RESPIRATORY_TRACT | Status: AC
  Filled 2011-05-12: qty 12

## 2011-05-12 NOTE — ED Notes (Signed)
Patient requesting refill on sodium chloride flushes for irrigation of trach.Patient from [facility] staff with patient at this time.

## 2011-05-12 NOTE — Progress Notes (Signed)
Suctioned and lavaged patient, multiple times per patient request. Obtained three thick yellow mucous plugs and moderate amount of thick yellow secretions. Patient tolerated suctioning well and maintained SpO2 between 94%-96% during procedure.

## 2011-05-12 NOTE — ED Provider Notes (Signed)
History     CSN: [id number]  Arrival date & time 05/12/11  1414   First MD Initiated Contact with Patient 05/12/11 1732      Chief Complaint  Patient presents with  . Medication Refill    (Consider location/radiation/quality/duration/timing/severity/associated sxs/prior treatment) HPI Comments: Patient presents for a refill of his normal saline which he uses to flush his trachea,  And which he ran out of 3 days ago.  He reports increasing problems with shortness of breath due to congestion.  He reports he is scheduled to see his cancer doctor at [facility] next week. He was diagnosed with a pneumonia one month ago and was admitted here.  He denies fevers,  States he just needs to be able to flush his trach and he will be ok.  The history is provided by the patient.    Past Medical History  Diagnosis Date  . Cancer   . Hypertension   . GERD (gastroesophageal reflux disease)   . Thyroid disease     Past Surgical History  Procedure Date  . Throat surgery     for cancer  . Leg surgery     History reviewed. No pertinent family history.  History  Substance Use Topics  . Smoking status: Former Games developer  . Smokeless tobacco: Not on file  . Alcohol Use: No      Review of Systems  Constitutional: Negative for fever.  HENT: Negative for congestion, sore throat and neck pain.   Eyes: Negative.   Respiratory: Positive for cough. Negative for chest tightness, shortness of breath, wheezing and stridor.   Cardiovascular: Negative for chest pain.  Gastrointestinal: Negative for nausea and abdominal pain.  Genitourinary: Negative.   Musculoskeletal: Negative for joint swelling and arthralgias.  Skin: Negative.  Negative for rash and wound.  Neurological: Negative for dizziness, weakness, light-headedness, numbness and headaches.  Hematological: Negative.   Psychiatric/Behavioral: Negative.     Allergies  Review of patient's allergies indicates no known allergies.  Home  Medications   Current Outpatient Rx  Name Route Sig Dispense Refill  . ACETAMINOPHEN 160 MG/5ML PO SUSP Oral Take 998.4 mg by mouth 4 (four) times daily. Give 31.18ml by mouth four times daily     . BISACODYL 5 MG PO TBEC Oral Take 10 mg by mouth daily.      Marland Kitchen VITAMIN D 2000 UNITS PO CAPS Oral Take 1 capsule by mouth daily.      . DEMECLOCYCLINE HCL 300 MG PO TABS Oral Take 300 mg by mouth 3 (three) times daily.      Marland Kitchen FERROUS SULFATE 220 (44 FE) MG/5ML PO ELIX Oral Take 330 mg by mouth 3 (three) times daily. Take 7..5 ml by mouth three times daily     . FLUDROCORTISONE ACETATE 0.1 MG PO TABS Oral Take 0.1 mg by mouth daily.      Marland Kitchen FOLIC ACID 1 MG PO TABS Oral Take 1 mg by mouth daily.      . GUAIFENESIN 100 MG/5ML PO SOLN Oral Take 10 mLs (200 mg total) by mouth 4 (four) times daily. 1200 mL   . LEVOTHYROXINE SODIUM 100 MCG PO TABS Oral Take 100-150 mcg by mouth as directed. Take one & one-half tablet by mouth on Saturdays and Sundays, then take one tablet on Mondays through Friday     . MAGNESIUM OXIDE 400 MG PO TABS Oral Take 400 mg by mouth daily.      . MELOXICAM 15 MG PO TABS Oral Take 15  mg by mouth daily.      . THERAVITE PO Oral Take 1 tablet by mouth daily.      Marland Kitchen OMEPRAZOLE 20 MG PO CPDR Oral Take 20 mg by mouth daily.      Marland Kitchen POLYETHYLENE GLYCOL 3350 PO POWD Oral Take 17 g by mouth daily.      Marland Kitchen POTASSIUM CHLORIDE 20 MEQ/15ML (10%) PO SOLN Oral Take 20 mEq by mouth daily. *Dilute one tablespoon (15ml) in 3 ounces of water and drink daily*     . VITAMIN B-1 100 MG PO TABS Oral Take 50 mg by mouth daily.      . TRIAMCINOLONE ACETONIDE 0.1 % EX CREA Topical Apply 1 application topically daily. To affected areas     . VITAMIN C 500 MG PO TABS Oral Take 1,000 mg by mouth 2 (two) times daily.        BP 164/86  Pulse 79  Temp(Src) 98.2 F (36.8 C) (Oral)  Resp 22  Ht 6' (1.829 m)  Wt 170 lb (77.111 kg)  BMI 23.06 kg/m2  SpO2 100%  Physical Exam  Nursing note and vitals  reviewed. Constitutional: He is oriented to person, place, and time. He appears well-developed and well-nourished.  HENT:  Head: Normocephalic and atraumatic.       Healthy appearing trach site.    Eyes: Conjunctivae are normal.  Neck: Normal range of motion.  Cardiovascular: Normal rate, regular rhythm, normal heart sounds and intact distal pulses.   Pulmonary/Chest: Effort normal and breath sounds normal. No respiratory distress. He has no wheezes. He has no rales. He exhibits no tenderness.  Abdominal: Soft. Bowel sounds are normal. There is no tenderness.  Musculoskeletal: Normal range of motion.  Neurological: He is alert and oriented to person, place, and time.  Skin: Skin is warm and dry.  Psychiatric: He has a normal mood and affect.    ED Course  Procedures (including critical care time)  Labs Reviewed - No data to display No results found.   1. Medication refill       MDM  Respiratory therapist flushed trach for patient.  No distress at time of discharge.  NS presciption given along with saline flushes for use until can get saline supply.          Candis Musa, PA 05/12/11 1904

## 2011-05-12 NOTE — ED Notes (Signed)
Pt A/O x 4. Resp even and unlabored. NAD at this time. D/C instructions reviewed with pt and caregiver. Both verbalized understanding. Pt ambulated to lobby with steady gate.

## 2011-05-13 NOTE — ED Provider Notes (Signed)
Medical screening examination/treatment/procedure(s) were performed by non-physician practitioner and as supervising physician I was immediately available for consultation/collaboration.  Nicoletta Dress. Colon Branch, MD 05/13/11 [phone]

## 2011-10-24 ENCOUNTER — Emergency Department (HOSPITAL_COMMUNITY)
Admission: EM | Admit: 2011-10-24 | Discharge: 2011-10-24 | Disposition: A | Discharge status: Home / Self Care | Payer: Medicaid Other | Attending: Emergency Medicine | Admitting: Emergency Medicine

## 2011-10-24 ENCOUNTER — Encounter (HOSPITAL_COMMUNITY): Payer: Self-pay

## 2011-10-24 ENCOUNTER — Emergency Department (HOSPITAL_COMMUNITY): Payer: Medicaid Other

## 2011-10-24 DIAGNOSIS — Z930 Tracheostomy status: Secondary | ICD-10-CM | POA: Insufficient documentation

## 2011-10-24 DIAGNOSIS — I10 Essential (primary) hypertension: Secondary | ICD-10-CM | POA: Insufficient documentation

## 2011-10-24 DIAGNOSIS — Z87891 Personal history of nicotine dependence: Secondary | ICD-10-CM | POA: Insufficient documentation

## 2011-10-24 DIAGNOSIS — K219 Gastro-esophageal reflux disease without esophagitis: Secondary | ICD-10-CM | POA: Insufficient documentation

## 2011-10-24 DIAGNOSIS — Z760 Encounter for issue of repeat prescription: Secondary | ICD-10-CM | POA: Insufficient documentation

## 2011-10-24 DIAGNOSIS — R0602 Shortness of breath: Secondary | ICD-10-CM | POA: Insufficient documentation

## 2011-10-24 DIAGNOSIS — E079 Disorder of thyroid, unspecified: Secondary | ICD-10-CM | POA: Insufficient documentation

## 2011-10-24 HISTORY — DX: Tracheostomy status: Z93.0

## 2011-10-24 NOTE — ED Notes (Signed)
Pt reports ran out of suction supplies at home 4 days ago and hasn't been able to suction trache.  PT says is SOB and feels like trache needs to be suctioned.  Pt alert and oriented.  Denies any chest pain, denies having had chest pain.

## 2011-10-24 NOTE — ED Notes (Signed)
Respiratory therapy suctioned pt's stoma at approx 1430.

## 2011-10-24 NOTE — ED Notes (Signed)
Pt has trach and c/o feeling sob for past 2 days. Denies cp. resp even/nonlabored. sats wnl on RA

## 2011-10-24 NOTE — ED Provider Notes (Signed)
History   This chart was scribed for Laray Anger, DO by Brooks Sailors. The patient was seen in room APA16A/APA16A. Patient's care was started at 1353.   CSN: [id number]  Arrival date & time 10/24/11  1353   First MD Initiated Contact with Patient 10/24/11 1521      Chief Complaint  Patient presents with  . Shortness of Breath  . Medication Refill     HPI Pt seen at 1600.   Per pt, c/o gradual onset and persistence of constant need for meds refill that began 4 days ago.  Pt states he ran out of his NS to perform his trach suction 4 days ago and has felt SOB.  Describes the SOB as "it feels like my trach needs to be suctioned."  Denies CP, no fevers, no abd pain, no back pain, no N/V/D.       Past Medical History  Diagnosis Date  . Cancer   . Hypertension   . GERD (gastroesophageal reflux disease)   . Thyroid disease   . Tracheostomy status     Past Surgical History  Procedure Date  . Throat surgery     for cancer  . Leg surgery   . Tracheostomy     History  Substance Use Topics  . Smoking status: Former Games developer  . Smokeless tobacco: Not on file  . Alcohol Use: No    Review of Systems ROS: Statement: All systems negative except as marked or noted in the HPI; Constitutional: Negative for fever and chills. ; ; Eyes: Negative for eye pain, redness and discharge. ; ; ENMT: Negative for ear pain, hoarseness, nasal congestion, sinus pressure and sore throat. ; ; Cardiovascular: Negative for chest pain, palpitations, diaphoresis, and peripheral edema. ; ; Respiratory: +SOB. Negative for cough, wheezing and stridor. ; ; Gastrointestinal: Negative for nausea, vomiting, diarrhea, abdominal pain, blood in stool, hematemesis, jaundice and rectal bleeding. . ; ; Genitourinary: Negative for dysuria, flank pain and hematuria. ; ; Musculoskeletal: Negative for back pain and neck pain. Negative for swelling and trauma.; ; Skin: Negative for pruritus, rash, abrasions, blisters,  bruising and skin lesion.; ; Neuro: Negative for headache, lightheadedness and neck stiffness. Negative for weakness, altered level of consciousness , altered mental status, extremity weakness, paresthesias, involuntary movement, seizure and syncope.     Allergies  Review of patient's allergies indicates no known allergies.  Home Medications   Current Outpatient Rx  Name Route Sig Dispense Refill  . ALUM & MAG HYDROXIDE-SIMETH 200-200-20 MG/5ML PO SUSP Oral Take 10-20 mLs by mouth every 6 (six) hours as needed. For indigestion    . BISACODYL 5 MG PO TBEC Oral Take 10 mg by mouth daily.      Marland Kitchen VITAMIN D 2000 UNITS PO CAPS Oral Take 1 capsule by mouth daily.      . DEMECLOCYCLINE HCL 300 MG PO TABS Oral Take 300 mg by mouth 3 (three) times daily.      Marland Kitchen FERROUS SULFATE 220 (44 FE) MG/5ML PO ELIX Oral Take 330 mg by mouth 3 (three) times daily. Take 7..5 ml by mouth three times daily     . FLUDROCORTISONE ACETATE 0.1 MG PO TABS Oral Take 0.1 mg by mouth daily.      Marland Kitchen FOLIC ACID 1 MG PO TABS Oral Take 1 mg by mouth daily.      . GUAIFENESIN 100 MG/5ML PO SOLN Oral Take 10 mLs (200 mg total) by mouth 4 (four) times daily. 1200 mL   .  LEVOTHYROXINE SODIUM 100 MCG PO TABS Oral Take 100-150 mcg by mouth daily. Takes 1 tablet Monday-Friday, 1 and 1/2  tablet on Saturday and Sunday    . LEVOTHYROXINE SODIUM 150 MCG PO TABS Oral Take 150 mcg by mouth daily.    Marland Kitchen LOPERAMIDE HCL 2 MG PO CAPS Oral Take 2 mg by mouth once as needed. For diarrhea. After each loose BM    . MAGNESIUM OXIDE 400 MG PO TABS Oral Take 400 mg by mouth daily.      Marland Kitchen MECLIZINE HCL 25 MG PO TABS Oral Take 25 mg by mouth 3 (three) times daily as needed. For dizziness    . MELOXICAM 15 MG PO TABS Oral Take 15 mg by mouth daily.      . THERAVITE PO Oral Take 1 tablet by mouth daily.      Marland Kitchen OMEPRAZOLE 20 MG PO CPDR Oral Take 20 mg by mouth daily.      Marland Kitchen ONDANSETRON HCL 4 MG PO TABS Oral Take 4 mg by mouth every 8 (eight) hours as  needed. For nausea    . POLYETHYLENE GLYCOL 3350 PO POWD Oral Take 17 g by mouth daily.      Marland Kitchen POTASSIUM CHLORIDE 20 MEQ/15ML (10%) PO SOLN Oral Take 20 mEq by mouth daily. *Dilute one tablespoon (15ml) in 3 ounces of water and drink daily*     . VITAMIN B-1 100 MG PO TABS Oral Take 50 mg by mouth daily.      . QC SORE THROAT MT Mouth/Throat Use as directed 1 spray in the mouth or throat every 2 (two) hours as needed. For sore throat    . TRIAMCINOLONE ACETONIDE 0.1 % EX CREA Topical Apply 1 application topically daily. To affected areas     . VITAMIN C 500 MG PO TABS Oral Take 1,000 mg by mouth 2 (two) times daily.      . ACETAMINOPHEN 160 MG/5ML PO SUSP Oral Take 998.4 mg by mouth 4 (four) times daily. Give 31.14ml by mouth four times daily       BP 139/78  Pulse 63  Temp 98.2 F (36.8 C) (Oral)  Resp 20  SpO2 100%  Physical Exam 1605: Physical examination:  Nursing notes reviewed; Vital signs and O2 SAT reviewed;  Constitutional: Well developed, Well nourished, Well hydrated, In no acute distress; Head:  Normocephalic, atraumatic; Eyes: EOMI, PERRL, No scleral icterus; ENMT: Mouth and pharynx normal, Mucous membranes moist; Neck: +trach stoma.  Supple, Full range of motion, No lymphadenopathy; Cardiovascular: Regular rate and rhythm, No murmur or gallop; Respiratory: Breath sounds coarse & equal bilaterally, No wheezes.  Speaking full sentences with ease, Normal respiratory effort/excursion; Chest: Nontender, Movement normal; Abdomen: Soft, Nontender, Nondistended, Normal bowel sounds;; Extremities: Pulses normal, No tenderness, No edema, No calf edema or asymmetry.; Neuro: AA&Ox3, Major CN grossly intact.  Speech clear. No gross focal motor or sensory deficits in extremities.; Skin: Color normal, Warm, Dry.   ED Course  Procedures    MDM  MDM Reviewed: nursing note, previous chart and vitals Reviewed previous: ECG Interpretation: x-ray and ECG      Date: 10/24/2011  Rate: 73   Rhythm: normal sinus rhythm  QRS Axis: normal  Intervals: normal  ST/T Wave abnormalities: normal  Conduction Disutrbances:none  Narrative Interpretation:   Old EKG Reviewed: unchanged; no significant changes from previous EKG dated 04/18/2011.  Dg Chest 2 View 10/24/2011  *RADIOLOGY REPORT*  Clinical Data: Cough.  CHEST - 2 VIEW  Comparison: 04/18/2011.  Findings: Trachea is midline.  Surgical clips are seen in the lower neck.  Right IJ Port-A-Cath tip projects over the SVC.  Heart size normal.  Biapical pleural thickening.  Lungs are otherwise clear. No pleural fluid.  IMPRESSION: No acute findings.  Original Report Authenticated By: Reyes Ivan, M.D.     4:47 PM:  Pt states he feels "better now" after RT has suctioned his trach.  VS remain stable, Sats 100% R/A.  Will refill NS flushes.  Pt encouraged to have facility call his PMD for refills of his chronic meds for good continuity of care.       I personally performed the services described in this documentation, which was scribed in my presence. The recorded information has been reviewed and considered. Minta Fair Allison Quarry, DO 10/27/11 [phone]

## 2011-10-24 NOTE — ED Notes (Signed)
Dr. Clarene Duke aware pt has already been suctioned.

## 2011-10-24 NOTE — Discharge Instructions (Signed)
RESOURCE GUIDE  Chronic Pain Problems: Contact [facility]  [phone] Patients need to be referred by their primary care doctor.  Insufficient Money for Medicine: Contact United Way:  call "211" or [facility] [phone].  No Primary Care Doctor: - Call [facility]  [phone] - can help you locate a primary care doctor that  accepts your insurance, provides certain services, etc. - Physician Referral Service- 1-800-533-3463  Agencies that provide inexpensive medical care: - [facility]  [phone] - [facility]  [phone] - [facility]  [phone] - Women's Clinic  [phone] - Planned Parenthood  [phone] - [facility]  [phone]  Medicaid-accepting [place] Providers: - [facility]- [address]  [phone], Mon-Fri 9am-7pm, Sat 9am-1pm - [facility]- [address], [address]  [phone] - [facility]- [address], [address]  [phone] - [facility]- [address] [place] [address]  [phone] - Veita Bland- [address], [phone]  Only accepts [place] Access Medicaid patients after they have their name  applied to their card  Self Pay (no insurance) in [place]: - Sickle Cell Patients: Justin Wilcox, [facility]  [address], [phone] - [facility]- [address]  [phone]       -     [facility]- [address]       -     [facility]- see information above (Speak to Pam H if you do not have insurance)       -  [facility]- [address], [phone]       -  [facility] [place]- [address],  [phone]       -  [facility]- [address] [place] [address], [phone]       -  [facility]-  [address], [place], [phone]       -  [facility]- [address], [phone]       -  [facility]- [address] [place] [address], [phone], also [address], [phone]       -    [facility]- [address], [phone], 1st & 3rd Saturday   every month, 10am-1pm  1) Find a Doctor and Pay Out of Pocket Although you won't have to find out who is covered by your insurance plan, it is a good idea to ask around and get recommendations. You will then need to call the office and see if the doctor you have chosen will accept you as a new patient and what types of options they offer for patients who are self-pay. Some doctors offer discounts or will set up payment plans for their patients who do not have insurance, but you will need to ask so you aren't surprised when you get to your appointment.  2) Contact Your Local Health Department Not all health departments have doctors that can see patients for sick visits, but many do, so it is worth a call to see if yours does. If you don't know where your local health department is, you can check in your phone book. The CDC also has a tool to help you locate your state's health department, and many state websites also have   listings of all of their local health departments.  3) Find a Walk-in Clinic If your illness is not likely to be very severe or complicated, you may want to try a walk in clinic. These are popping up all over the country in pharmacies, drugstores, and shopping centers. They're usually staffed by nurse practitioners or physician assistants that have been trained to treat common illnesses and complaints. They're usually fairly quick and inexpensive. However, if you have serious medical issues or chronic medical problems, these are probably not your best option  STD Testing - [place] [facility] [place], [facility] Clinic, [address], [place], phone [phone] or 1-877-539-9860.  Monday - Friday, call for an appointment. - [place]  [facility] [place], [facility] Clinic, [address], [place], phone [phone] or 1-877-539-9860.  Monday - Friday, call for an appointment.  Abuse/Neglect: - [place] Child Abuse Hotline (336) 641-3795 - [place] Child Abuse Hotline 800-378-5315 (After Hours)  Emergency Shelter:  [facility] (336) 271-5985  Maternity Homes: - [facility] (336) 275-9566 - [facility] (704) 372-4663  MRSA Hotline #:   832-7006  [place] Resources  [facility] [place]  [employer] [place] [facility] [address]                 [address]         [address]  [place]                                               [place]                              [place] Phone:  [phone]                                  Phone:  [phone]                   Phone:  [phone]  [place] [facility], [phone] - [place] Services - [facility]- 1-888-581-9988       -     [facility] in [place], [address],                                  336-349-4454, Insurance  [place] Child Abuse Hotline (336) 342-1394 or (336) 342-3537 (After Hours)   Behavioral Health Services  Substance Abuse Resources: - [facility] Drug [facility]  336-882-2125 - [facility] 336-784-9470 - [facility] 336-285-9073 - [facility] 336-845-3988 - Residential & Outpatient Substance Abuse Program  800-659-3381  Psychological Services: -  Health  [phone] - [facility]  [phone] - [place] [facility], [address], [place], ACCESS LINE: 1-800-853-5163 or 336-641-4981, Http://www.[facility].com/services/adult.htm  Dental Assistance  If unable to pay or uninsured, contact:  [facility] or [place] [facility] to become qualified for the adult dental  clinic.  Patients with Medicaid:  [facility] [address], [phone], [phone]  If unable   to pay, or uninsured, contact [facility] (628)276-9237) or [facility] Department (915)133-0031 in [place], [phone] in [facility]) to become qualified for the adult dental clinic  Other Low-Cost Community Dental Services: - [facility]- [address] [place], [place], [place], [address], [phone], [address]. [phone], 2nd and 4th Thursday of the month at 6:30am.  10 clients each day by appointment, can sometimes see walk-in patients if someone does not show for an appointment. [facility]- [address] [place], [place], [address], [phone] - [facility]- [address], [place], [place], [address], [phone] [facility] Department- [phone] [facility] Department- [phone] [facility] [phone]     Take the prescription as directed.  Call your regular medical doctor today to schedule a follow up appointment within the next 2 days.  Return to the Emergency Department immediately sooner if worsening.

## 2011-10-24 NOTE — ED Notes (Signed)
Facility staff here for pt.

## 2012-10-23 ENCOUNTER — Emergency Department (HOSPITAL_COMMUNITY)
Admission: EM | Admit: 2012-10-23 | Discharge: 2012-10-23 | Disposition: A | Discharge status: Home / Self Care | Payer: Medicaid Other | Attending: Emergency Medicine | Admitting: Emergency Medicine

## 2012-10-23 ENCOUNTER — Emergency Department (HOSPITAL_COMMUNITY): Payer: Medicaid Other

## 2012-10-23 ENCOUNTER — Encounter (HOSPITAL_COMMUNITY): Payer: Self-pay | Admitting: *Deleted

## 2012-10-23 DIAGNOSIS — Z87891 Personal history of nicotine dependence: Secondary | ICD-10-CM | POA: Insufficient documentation

## 2012-10-23 DIAGNOSIS — Z930 Tracheostomy status: Secondary | ICD-10-CM | POA: Insufficient documentation

## 2012-10-23 DIAGNOSIS — Z792 Long term (current) use of antibiotics: Secondary | ICD-10-CM | POA: Insufficient documentation

## 2012-10-23 DIAGNOSIS — I10 Essential (primary) hypertension: Secondary | ICD-10-CM | POA: Insufficient documentation

## 2012-10-23 DIAGNOSIS — Z8521 Personal history of malignant neoplasm of larynx: Secondary | ICD-10-CM | POA: Insufficient documentation

## 2012-10-23 DIAGNOSIS — E079 Disorder of thyroid, unspecified: Secondary | ICD-10-CM | POA: Insufficient documentation

## 2012-10-23 DIAGNOSIS — Z791 Long term (current) use of non-steroidal anti-inflammatories (NSAID): Secondary | ICD-10-CM | POA: Insufficient documentation

## 2012-10-23 DIAGNOSIS — R0602 Shortness of breath: Secondary | ICD-10-CM | POA: Insufficient documentation

## 2012-10-23 DIAGNOSIS — R062 Wheezing: Secondary | ICD-10-CM | POA: Insufficient documentation

## 2012-10-23 DIAGNOSIS — R109 Unspecified abdominal pain: Secondary | ICD-10-CM | POA: Insufficient documentation

## 2012-10-23 DIAGNOSIS — K219 Gastro-esophageal reflux disease without esophagitis: Secondary | ICD-10-CM | POA: Insufficient documentation

## 2012-10-23 DIAGNOSIS — J9801 Acute bronchospasm: Secondary | ICD-10-CM

## 2012-10-23 MED ORDER — IPRATROPIUM BROMIDE 0.02 % IN SOLN
0.5000 mg | Freq: Once | RESPIRATORY_TRACT | Status: AC
  Administered 2012-10-23: 0.5 mg via RESPIRATORY_TRACT
  Filled 2012-10-23: qty 2.5

## 2012-10-23 MED ORDER — PREDNISONE 50 MG PO TABS
60.0000 mg | ORAL_TABLET | Freq: Once | ORAL | Status: AC
  Administered 2012-10-23: 60 mg via ORAL
  Filled 2012-10-23: qty 1

## 2012-10-23 MED ORDER — ALBUTEROL SULFATE (5 MG/ML) 0.5% IN NEBU
5.0000 mg | INHALATION_SOLUTION | Freq: Once | RESPIRATORY_TRACT | Status: AC
  Administered 2012-10-23: 5 mg via RESPIRATORY_TRACT
  Filled 2012-10-23: qty 1

## 2012-10-23 MED ORDER — SODIUM CHLORIDE 3 % IN NEBU: INHALATION_SOLUTION | RESPIRATORY_TRACT | Status: DC | PRN

## 2012-10-23 NOTE — ED Notes (Signed)
Pt with trach, cough that started this morning, unknown of fever, states last suctioned 2 days ago, pt states he suctions self x3/ day

## 2012-10-23 NOTE — ED Provider Notes (Signed)
History    This chart was scribed for Hurman Horn, MD by Quintella Reichert, ED scribe.  This patient was seen in room APA01/APA01 and the patient's care was started at 3:26 PM.   CSN: [id number]  Arrival date & time 10/23/12  1436      Chief Complaint  Patient presents with  . Cough     The history is provided by the patient. No language interpreter was used.    HPI Comments: Justin Wilcox is a 62 y.o. male with h/o laryngeal cancer and tracheostomy who presents to the Emergency Department complaining of productive cough that began today, with accompanying mild SOB and mild intermittent abdominal pain secondary to cough.  Cough is productive of clear sputum.  Pt notes that cough is chronic but presently much more severe than baseline.  He reports that he normally uses saline flush and suction at home, but ran out of his saline 2 days ago.  He has h/o ED visits for same symptoms which were relived by suction given ED.  Pt lives in assisted leaving.      Past Medical History  Diagnosis Date  . Cancer   . Hypertension   . GERD (gastroesophageal reflux disease)   . Thyroid disease   . Tracheostomy status     Past Surgical History  Procedure Laterality Date  . Throat surgery      for cancer  . Leg surgery    . Tracheostomy      No family history on file.  History  Substance Use Topics  . Smoking status: Former Games developer  . Smokeless tobacco: Not on file  . Alcohol Use: No      Review of Systems 10 Systems reviewed and all are negative for acute change except as noted in the HPI.    Allergies  Review of patient's allergies indicates no known allergies.  Home Medications   Current Outpatient Rx  Name  Route  Sig  Dispense  Refill  . acetaminophen (NON-ASPIRIN) 325 MG tablet   Oral   Take 650 mg by mouth every 6 (six) hours as needed for pain (*Takes while awake*).         Marland Kitchen bisacodyl (DULCOLAX) 5 MG EC tablet   Oral   Take 10 mg by mouth daily.            . Cholecalciferol (VITAMIN D) 2000 UNITS CAPS   Oral   Take 1 capsule by mouth daily.           Marland Kitchen demeclocycline (DECLOMYCIN) 300 MG tablet   Oral   Take 300 mg by mouth 3 (three) times daily.           . ferrous sulfate 220 (44 FE) MG/5ML solution   Oral   Take 330 mg by mouth 3 (three) times daily. Take 7..5 ml by mouth three times daily          . folic acid (FOLVITE) 1 MG tablet   Oral   Take 1 mg by mouth daily.           Marland Kitchen levothyroxine (SYNTHROID, LEVOTHROID) 125 MCG tablet   Oral   Take 125 mcg by mouth daily.         . magnesium oxide (MAG-OX) 400 MG tablet   Oral   Take 400 mg by mouth daily.           . meloxicam (MOBIC) 15 MG tablet   Oral   Take 15 mg by  mouth daily.           . Multiple Vitamin (THERAVITE PO)   Oral   Take 1 tablet by mouth daily.           Marland Kitchen omeprazole (PRILOSEC) 20 MG capsule   Oral   Take 20 mg by mouth daily.           . polyethylene glycol powder (GLYCOLAX/MIRALAX) powder   Oral   Take 17 g by mouth daily.           . potassium chloride 20 MEQ/15ML (10%) SOLN   Oral   Take 20 mEq by mouth daily. *Dilute one tablespoon (15ml) in 3 ounces of water and drink daily*          . thiamine (VITAMIN B-1) 100 MG tablet   Oral   Take 50 mg by mouth daily.           . vitamin C (ASCORBIC ACID) 500 MG tablet   Oral   Take 1,000 mg by mouth 2 (two) times daily.           . ondansetron (ZOFRAN) 4 MG tablet   Oral   Take 4 mg by mouth every 8 (eight) hours as needed. For nausea         . sodium chloride HYPERTONIC 3 % nebulizer solution   Nebulization   Take by nebulization as needed for other.   750 mL   0   . triamcinolone cream (KENALOG) 0.1 %   Topical   Apply 1 application topically daily. To affected areas            BP 132/72  Pulse 72  Temp(Src) 98.2 F (36.8 C) (Oral)  Resp 16  Ht 6\' 2"  (1.88 m)  Wt 125 lb (56.7 kg)  BMI 16.04 kg/m2  SpO2 100%  Physical Exam  Nursing note and  vitals reviewed. Constitutional:  Awake, alert, nontoxic appearance.  HENT:  Head: Atraumatic.  Eyes: Right eye exhibits no discharge. Left eye exhibits no discharge.  Neck: Neck supple.  Cardiovascular: Normal rate, regular rhythm and normal heart sounds.   No murmur heard. Pulmonary/Chest: Effort normal. No respiratory distress. He has wheezes (Mild diffuse wheezes). He has no rales. He exhibits no tenderness.  Scattered rhonchi, no crackles  Abdominal: Soft. Bowel sounds are normal. There is no tenderness. There is no rebound.  Musculoskeletal: He exhibits no edema and no tenderness.  Baseline ROM, no obvious new focal weakness.  Neurological:  Mental status and motor strength appears baseline for patient and situation.  Skin: No rash noted.  Psychiatric: He has a normal mood and affect.    ED Course  Procedures (including critical care time)  DIAGNOSTIC STUDIES: Oxygen Saturation is 100% on room air, normal by my interpretation.    COORDINATION OF CARE: 3:31 PM-Discussed treatment plan which includes CXR, steroid treatment and breathing treatment with pt at bedside and pt agreed to plan.   Patient understands and agrees with initial ED impression and plan with expectations set for ED visit.  6:35 PM: Pt states he feels improved, minimal baseline wheezing.  Feels ready for home.  Patient informed of clinical course, understands medical decision-making process, and agrees with plan.  Labs Reviewed - No data to display Dg Chest 2 View  10/23/2012   *RADIOLOGY REPORT*  Clinical Data: Cough  CHEST - 2 VIEW  Comparison: 10/24/2011  Findings: The cardiomediastinal silhouette is stable.  Right IJ Port-A-Cath is unchanged in position.  No  acute infiltrate or pulmonary edema.  Surgical clips within the soft tissue neck again noted.  IMPRESSION: No active disease.  No significant change.   Original Report Authenticated By: Natasha Mead, M.D.     1. Bronchospasm       MDM  I  personally performed the services described in this documentation, which was scribed in my presence. The recorded information has been reviewed and is accurate. Patient / Family / Caregiver informed of clinical course, understand medical decision-making process, and agree with plan. I doubt any other EMC precluding discharge at this time including, but not necessarily limited to the following:SBI.   Hurman Horn, MD 10/24/12 1321

## 2012-10-23 NOTE — ED Notes (Signed)
[facility] called to come pick up pt.

## 2012-10-23 NOTE — ED Notes (Signed)
Pt resting in bed, no further requests at this time, updated on wait status

## 2012-10-23 NOTE — ED Notes (Addendum)
Pt has trach with voice box - states is coughing and needs suctioning.   Pt also reporting abd pain and n/v.

## 2012-10-23 NOTE — Progress Notes (Signed)
Patient with stoma, SATs 100% on RA and HR 56, RR 16. BBS diminished with expiratory wheeze. Rt gave breathing treatment with 5mg  albuterol and 05mg  atrovent. Patient requested to be suctioned. Patient with good cough, coughing up moderate yellow thick secretions; however, some still remains in stoma sight. RT suctioned 2x with 77fr catether for moderate amount of thick yellow secretions and small amount of blood tinged secretions. Patient remained stable throughout with SATs 100% and HR 54.

## 2012-12-04 ENCOUNTER — Emergency Department (HOSPITAL_COMMUNITY)
Admission: EM | Admit: 2012-12-04 | Discharge: 2012-12-04 | Disposition: A | Discharge status: Home / Self Care | Payer: Medicaid Other | Attending: Emergency Medicine | Admitting: Emergency Medicine

## 2012-12-04 ENCOUNTER — Encounter (HOSPITAL_COMMUNITY): Payer: Self-pay | Admitting: Emergency Medicine

## 2012-12-04 DIAGNOSIS — R05 Cough: Secondary | ICD-10-CM | POA: Insufficient documentation

## 2012-12-04 DIAGNOSIS — Z87891 Personal history of nicotine dependence: Secondary | ICD-10-CM | POA: Insufficient documentation

## 2012-12-04 DIAGNOSIS — Z8589 Personal history of malignant neoplasm of other organs and systems: Secondary | ICD-10-CM | POA: Insufficient documentation

## 2012-12-04 DIAGNOSIS — K219 Gastro-esophageal reflux disease without esophagitis: Secondary | ICD-10-CM | POA: Insufficient documentation

## 2012-12-04 DIAGNOSIS — Z79899 Other long term (current) drug therapy: Secondary | ICD-10-CM | POA: Insufficient documentation

## 2012-12-04 DIAGNOSIS — R059 Cough, unspecified: Secondary | ICD-10-CM | POA: Insufficient documentation

## 2012-12-04 DIAGNOSIS — Y833 Surgical operation with formation of external stoma as the cause of abnormal reaction of the patient, or of later complication, without mention of misadventure at the time of the procedure: Secondary | ICD-10-CM | POA: Insufficient documentation

## 2012-12-04 DIAGNOSIS — J9503 Malfunction of tracheostomy stoma: Secondary | ICD-10-CM | POA: Insufficient documentation

## 2012-12-04 DIAGNOSIS — R0789 Other chest pain: Secondary | ICD-10-CM | POA: Insufficient documentation

## 2012-12-04 DIAGNOSIS — Z930 Tracheostomy status: Secondary | ICD-10-CM | POA: Insufficient documentation

## 2012-12-04 DIAGNOSIS — Z430 Encounter for attention to tracheostomy: Secondary | ICD-10-CM

## 2012-12-04 DIAGNOSIS — R109 Unspecified abdominal pain: Secondary | ICD-10-CM | POA: Insufficient documentation

## 2012-12-04 DIAGNOSIS — I10 Essential (primary) hypertension: Secondary | ICD-10-CM | POA: Insufficient documentation

## 2012-12-04 DIAGNOSIS — E079 Disorder of thyroid, unspecified: Secondary | ICD-10-CM | POA: Insufficient documentation

## 2012-12-04 NOTE — ED Provider Notes (Signed)
CSN: [id number]     Arrival date & time 12/04/12  1545 History  This chart was scribed for Justin Skeens, MD by Bennett Scrape, ED Scribe. This patient was seen in room APA14/APA14 and the patient's care was started at 4:25 PM.   First MD Initiated Contact with Patient 12/04/12 1604     Chief Complaint  Patient presents with  . Tracheostomy Problem     The history is provided by the patient. No language interpreter was used.    HPI Comments: [name] is a 62 y.o. male who presents to the Emergency Department complaining of being out of saline for his trach stoma cleaning site. He denies being out of suction catheters. He denies having a trach that he puts in. He denies purulent or bloody drainage from the site. He c/o periumbilical abdominal pain, sternal CP and cough denies fever or chills. He denies having any pain currently. He has a h/o HTN, GERD and malignant neoplasm of sternum.  He was getting radiation for the CA.    Past Medical History  Diagnosis Date  . Cancer   . Hypertension   . GERD (gastroesophageal reflux disease)   . Thyroid disease   . Tracheostomy status    Past Surgical History  Procedure Laterality Date  . Throat surgery      for cancer  . Leg surgery    . Tracheostomy     No family history on file. History  Substance Use Topics  . Smoking status: Former Games developer  . Smokeless tobacco: Not on file  . Alcohol Use: No    Review of Systems  Constitutional: Negative for fever and chills.  Respiratory: Positive for cough.   Cardiovascular: Positive for chest pain (none currently).  Gastrointestinal: Positive for abdominal pain (none currently).  All other systems reviewed and are negative.    Allergies  Review of patient's allergies indicates no known allergies.  Home Medications   Current Outpatient Rx  Name  Route  Sig  Dispense  Refill  . acetaminophen (NON-ASPIRIN) 325 MG tablet   Oral   Take 650 mg by mouth every 6 (six) hours as  needed for pain (*Takes while awake*).         Marland Kitchen bisacodyl (DULCOLAX) 5 MG EC tablet   Oral   Take 10 mg by mouth daily.           . Cholecalciferol (VITAMIN D) 2000 UNITS CAPS   Oral   Take 1 capsule by mouth daily.           Marland Kitchen demeclocycline (DECLOMYCIN) 300 MG tablet   Oral   Take 300 mg by mouth 3 (three) times daily.           . ferrous sulfate 220 (44 FE) MG/5ML solution   Oral   Take 330 mg by mouth 3 (three) times daily. Take 7..5 ml by mouth three times daily          . folic acid (FOLVITE) 1 MG tablet   Oral   Take 1 mg by mouth daily.           Marland Kitchen levothyroxine (SYNTHROID, LEVOTHROID) 125 MCG tablet   Oral   Take 125 mcg by mouth daily.         . magnesium oxide (MAG-OX) 400 MG tablet   Oral   Take 400 mg by mouth daily.           . meloxicam (MOBIC) 15 MG tablet  Oral   Take 15 mg by mouth daily.           . Multiple Vitamin (THERAVITE PO)   Oral   Take 1 tablet by mouth daily.           Marland Kitchen omeprazole (PRILOSEC) 20 MG capsule   Oral   Take 20 mg by mouth daily.           . polyethylene glycol powder (GLYCOLAX/MIRALAX) powder   Oral   Take 17 g by mouth daily.           . potassium chloride 20 MEQ/15ML (10%) SOLN   Oral   Take 20 mEq by mouth daily. *Dilute one tablespoon (15ml) in 3 ounces of water and drink daily*          . sodium chloride irrigation 0.9 % irrigation   Irrigation   Irrigate with as directed 3 (three) times daily. Used for trache irrigation         . thiamine (VITAMIN B-1) 100 MG tablet   Oral   Take 50 mg by mouth daily.           Marland Kitchen triamcinolone cream (KENALOG) 0.1 %   Topical   Apply 1 application topically daily. To affected areas          . vitamin C (ASCORBIC ACID) 500 MG tablet   Oral   Take 1,000 mg by mouth 2 (two) times daily.           . ondansetron (ZOFRAN) 4 MG tablet   Oral   Take 4 mg by mouth every 8 (eight) hours as needed. For nausea         . sodium chloride  HYPERTONIC 3 % nebulizer solution   Nebulization   Take by nebulization as needed for other.   750 mL   0    Triage Vitals: BP 126/69  Pulse 78  Temp(Src) 98.1 F (36.7 C) (Oral)  Resp 18  Ht 6\' 2"  (1.88 m)  Wt 125 lb (56.7 kg)  BMI 16.04 kg/m2  SpO2 100%  Physical Exam  Nursing note and vitals reviewed. Constitutional: He is oriented to person, place, and time. He appears well-developed and well-nourished. No distress.  HENT:  Head: Normocephalic and atraumatic.  Eyes: EOM are normal.  Neck: Neck supple. No tracheal deviation present.   Trach stoma present, skin is intact, no bleeding, no drainage, no erythema, well-healed scars from previous surgeries   Cardiovascular: Normal rate.   Pulmonary/Chest: Effort normal. No respiratory distress.  Musculoskeletal: Normal range of motion.  Neurological: He is alert and oriented to person, place, and time.  Skin: Skin is warm and dry.  Psychiatric: He has a normal mood and affect. His behavior is normal.    ED Course   Procedures (including critical care time)  DIAGNOSTIC STUDIES: Oxygen Saturation is 100% on room air, normal by my interpretation.    COORDINATION OF CARE: 4:29 PM- Discussed discharge plan which includes saline with pt and pt agreed to plan. Also advised pt to follow up as needed and pt agreed. Addressed symptoms to return for with pt.    Labs Reviewed - No data to display No results found. No diagnosis found.  MDM  I personally performed the services described in this documentation, which was scribed in my presence. The recorded information has been reviewed and is accurate.  Difficulty understanding patient, had him write his concerns to clarify.  He was out of saline that he regularly  uses for his trach stoma.  No signs of bleeding or infection.  Pt well appearing, no other concerns. Multiple saline bottles given. DC with outpt fup.    Justin Skeens, MD 12/09/12 2229

## 2012-12-04 NOTE — ED Notes (Signed)
Pt c/o problems with trach stoma. Pt states he ran out of his normal saline prescription to clean out site.  Pt states he is having trouble swallowing and when he eats or drinks it is coming out of his nose x 2 days. Pt also c/o n/v/d x 2 days. Denies difficulty breathing.

## 2013-02-20 ENCOUNTER — Encounter (HOSPITAL_COMMUNITY): Payer: Self-pay | Admitting: Emergency Medicine

## 2013-02-20 ENCOUNTER — Emergency Department (HOSPITAL_COMMUNITY)
Admission: EM | Admit: 2013-02-20 | Discharge: 2013-02-20 | Disposition: A | Discharge status: Home / Self Care | Payer: Medicaid Other | Attending: Emergency Medicine | Admitting: Emergency Medicine

## 2013-02-20 ENCOUNTER — Emergency Department (HOSPITAL_COMMUNITY): Payer: Medicaid Other

## 2013-02-20 DIAGNOSIS — S46909A Unspecified injury of unspecified muscle, fascia and tendon at shoulder and upper arm level, unspecified arm, initial encounter: Secondary | ICD-10-CM | POA: Insufficient documentation

## 2013-02-20 DIAGNOSIS — Y939 Activity, unspecified: Secondary | ICD-10-CM | POA: Insufficient documentation

## 2013-02-20 DIAGNOSIS — Z79899 Other long term (current) drug therapy: Secondary | ICD-10-CM | POA: Insufficient documentation

## 2013-02-20 DIAGNOSIS — Z791 Long term (current) use of non-steroidal anti-inflammatories (NSAID): Secondary | ICD-10-CM | POA: Insufficient documentation

## 2013-02-20 DIAGNOSIS — K219 Gastro-esophageal reflux disease without esophagitis: Secondary | ICD-10-CM | POA: Insufficient documentation

## 2013-02-20 DIAGNOSIS — S139XXA Sprain of joints and ligaments of unspecified parts of neck, initial encounter: Secondary | ICD-10-CM | POA: Insufficient documentation

## 2013-02-20 DIAGNOSIS — Z85828 Personal history of other malignant neoplasm of skin: Secondary | ICD-10-CM | POA: Insufficient documentation

## 2013-02-20 DIAGNOSIS — S161XXA Strain of muscle, fascia and tendon at neck level, initial encounter: Secondary | ICD-10-CM

## 2013-02-20 DIAGNOSIS — I10 Essential (primary) hypertension: Secondary | ICD-10-CM | POA: Insufficient documentation

## 2013-02-20 DIAGNOSIS — Y929 Unspecified place or not applicable: Secondary | ICD-10-CM | POA: Insufficient documentation

## 2013-02-20 DIAGNOSIS — X58XXXA Exposure to other specified factors, initial encounter: Secondary | ICD-10-CM | POA: Insufficient documentation

## 2013-02-20 DIAGNOSIS — E079 Disorder of thyroid, unspecified: Secondary | ICD-10-CM | POA: Insufficient documentation

## 2013-02-20 DIAGNOSIS — S4980XA Other specified injuries of shoulder and upper arm, unspecified arm, initial encounter: Secondary | ICD-10-CM | POA: Insufficient documentation

## 2013-02-20 DIAGNOSIS — Z87891 Personal history of nicotine dependence: Secondary | ICD-10-CM | POA: Insufficient documentation

## 2013-02-20 MED ORDER — ACETAMINOPHEN-CODEINE #3 300-30 MG PO TABS: 1.0000 | ORAL_TABLET | Freq: Four times a day (QID) | ORAL | Status: DC | PRN

## 2013-02-20 MED ORDER — METHOCARBAMOL 500 MG PO TABS: 500.0000 mg | ORAL_TABLET | Freq: Every evening | ORAL | Status: DC | PRN

## 2013-02-20 MED ORDER — HYDROCODONE-ACETAMINOPHEN 5-325 MG PO TABS
1.0000 | ORAL_TABLET | Freq: Once | ORAL | Status: AC
  Administered 2013-02-20: 1 via ORAL
  Filled 2013-02-20: qty 1

## 2013-02-20 NOTE — ED Notes (Signed)
Pt  C/o pain to right shoulder pain that radiates into right forearm. Pain is worse with movement. rom limited due to pain. Nad. Denies any chest pressure/tightness.

## 2013-02-20 NOTE — ED Notes (Signed)
Justin Wilcox [phone] contact number for transport.

## 2013-02-20 NOTE — ED Provider Notes (Signed)
CSN: [id number]     Arrival date & time 02/20/13  1117 History   First MD Initiated Contact with Patient 02/20/13 1152     Chief Complaint  Patient presents with  . Neck Pain  . Shoulder Pain   (Consider location/radiation/quality/duration/timing/severity/associated sxs/prior Treatment) Patient is a 62 y.o. male presenting with neck pain and shoulder pain. The history is provided by the patient.  Neck Pain Pain location:  R side Quality:  Stabbing and shooting Pain radiates to:  R shoulder Pain severity:  Moderate (7/10) Pain is:  Same all the time Onset quality:  Gradual Duration:  5 days Timing:  Constant Progression:  Worsening Chronicity:  New Associated symptoms: no chest pain, no fever and no headaches   Shoulder Pain Associated symptoms include neck pain. Pertinent negatives include no chest pain, chills, congestion, fever, headaches, nausea, rash or vomiting.   Justin Wilcox is a 62 y.o. male who presents to the ED with right side neck pain and right shoulder pain. The pain increases with movement/range of motion. Denies chest pain or pressure. He has had suregery on his throat for cancer and he has thyroid disease.  Past Medical History  Diagnosis Date  . Cancer   . Hypertension   . GERD (gastroesophageal reflux disease)   . Thyroid disease   . Tracheostomy status    Past Surgical History  Procedure Laterality Date  . Throat surgery      for cancer  . Leg surgery    . Tracheostomy     History reviewed. No pertinent family history. History  Substance Use Topics  . Smoking status: Former Games developer  . Smokeless tobacco: Not on file  . Alcohol Use: No    Review of Systems  Constitutional: Negative for fever and chills.  HENT: Negative for congestion.   Eyes: Negative for pain.  Respiratory: Negative for chest tightness.   Cardiovascular: Negative for chest pain.  Gastrointestinal: Negative for nausea and vomiting.  Musculoskeletal: Positive for neck pain.         Right shoulder pain.  Skin: Negative for rash.  Neurological: Negative for headaches.  Psychiatric/Behavioral: The patient is not nervous/anxious.     Allergies  Review of patient's allergies indicates no known allergies.  Home Medications   Current Outpatient Rx  Name  Route  Sig  Dispense  Refill  . acetaminophen (NON-ASPIRIN) 325 MG tablet   Oral   Take 650 mg by mouth every 6 (six) hours as needed for pain (*Takes while awake*).         Marland Kitchen bisacodyl (DULCOLAX) 5 MG EC tablet   Oral   Take 10 mg by mouth daily.           . Cholecalciferol (VITAMIN D) 2000 UNITS CAPS   Oral   Take 1 capsule by mouth daily.           Marland Kitchen demeclocycline (DECLOMYCIN) 300 MG tablet   Oral   Take 300 mg by mouth 3 (three) times daily.           . ferrous sulfate 220 (44 FE) MG/5ML solution   Oral   Take 330 mg by mouth 3 (three) times daily. Take 7..5 ml by mouth three times daily          . folic acid (FOLVITE) 1 MG tablet   Oral   Take 1 mg by mouth daily.           Marland Kitchen levothyroxine (SYNTHROID, LEVOTHROID) 125 MCG tablet  Oral   Take 125 mcg by mouth daily.         . magnesium oxide (MAG-OX) 400 MG tablet   Oral   Take 400 mg by mouth daily.           . meloxicam (MOBIC) 15 MG tablet   Oral   Take 15 mg by mouth daily.           . Multiple Vitamin (THERAVITE PO)   Oral   Take 1 tablet by mouth daily.           Marland Kitchen omeprazole (PRILOSEC) 20 MG capsule   Oral   Take 20 mg by mouth daily.           . ondansetron (ZOFRAN) 4 MG tablet   Oral   Take 4 mg by mouth every 8 (eight) hours as needed. For nausea         . polyethylene glycol powder (GLYCOLAX/MIRALAX) powder   Oral   Take 17 g by mouth daily.           . potassium chloride 20 MEQ/15ML (10%) SOLN   Oral   Take 20 mEq by mouth daily. *Dilute one tablespoon (15ml) in 3 ounces of water and drink daily*          . sodium chloride HYPERTONIC 3 % nebulizer solution   Nebulization   Take  by nebulization as needed for other.   750 mL   0   . sodium chloride irrigation 0.9 % irrigation   Irrigation   Irrigate with as directed 3 (three) times daily. Used for trache irrigation         . thiamine (VITAMIN B-1) 100 MG tablet   Oral   Take 50 mg by mouth daily.           Marland Kitchen triamcinolone cream (KENALOG) 0.1 %   Topical   Apply 1 application topically daily. To affected areas          . vitamin C (ASCORBIC ACID) 500 MG tablet   Oral   Take 1,000 mg by mouth 2 (two) times daily.            BP 143/70  Pulse 70  Temp(Src) 98.6 F (37 C)  Resp 21  SpO2 98% Physical Exam  Nursing note and vitals reviewed. Constitutional: He is oriented to person, place, and time. He appears well-developed and well-nourished.  HENT:  Head: Atraumatic.  Eyes: EOM are normal.  Neck: Neck supple. Muscular tenderness present. Decreased range of motion present.  Cardiovascular: Normal rate.   Pulmonary/Chest: Effort normal.  Musculoskeletal:       Right shoulder: He exhibits decreased range of motion and tenderness. He exhibits no swelling, no deformity, no laceration, normal pulse and normal strength.       Cervical back: He exhibits decreased range of motion, tenderness and spasm. He exhibits normal pulse.       Back:  Pain with palpation and range of motion right side of neck that radiates to the right shoulder. Radial pulses present adequate circulation, good touch sensation  Neurological: He is alert and oriented to person, place, and time. No cranial nerve deficit.  Skin: Skin is warm and dry.  Psychiatric: He has a normal mood and affect.    ED Course: Dr. Gwendolyn Grant in to examine the patient.  Procedures EKG Interpretation   None      Ct Cervical Spine Wo Contrast  02/20/2013   CLINICAL DATA:  62 year old male with  neck pain. History of throat cancer and surgery.  EXAM: CT CERVICAL SPINE WITHOUT CONTRAST  TECHNIQUE: Multidetector CT imaging of the cervical spine was  performed without intravenous contrast. Multiplanar CT image reconstructions were also generated.  COMPARISON:  12/22/2007 preoperative neck CT with contrast.  FINDINGS: Mild reversal of the normal cervical lordosis noted.  There is no evidence of fracture or subluxation.  Mild multilevel degenerative disc disease is noted with mild to moderate degenerative disk disease and spondylosis at C5-C6.  Along with congenitally slightly short pedicles, mild central spinal narrowing throughout the cervical spine noted.  Postoperative changes within the neck identified.  Soft tissue thickening involving the upper pharynx and prevertebral regions identified. Although this could all represent postoperative changes, neoplasm is difficult to exclude. If  No focal bony lesions are identified.  IMPRESSION: No acute bony abnormality.  Mild multilevel degenerative disc disease and mild to moderate degenerative changes at C5-6 contributing to mild central spinal narrowing throughout the cervical spine.  Postoperative changes within the neck with pharyngeal and prevertebral soft tissue thickening. This soft tissue thickening may be postoperative, however, if there are no outside postoperative CTs/MRs for comparison, then CT of the neck with contrast is recommended to evaluate for residual/recurrent disease.   Electronically Signed   By: Laveda Abbe M.D.   On: 02/20/2013 13:07    MDM  62 y.o. male with muscular pain to the right side of his neck. Will treat with Robaxin at bedtime only since it may make him sleepy and or dizzy. He is currently taking Mobic and will continue.  Discussed with the patient and all questioned fully answered.   Medication List    TAKE these medications       acetaminophen-codeine 300-30 MG per tablet  Commonly known as:  TYLENOL #3  Take 1-2 tablets by mouth every 6 (six) hours as needed for pain.     methocarbamol 500 MG tablet  Commonly known as:  ROBAXIN  Take 1 tablet (500 mg total) by mouth  at bedtime as needed.      ASK your doctor about these medications       acetaminophen 160 MG/5ML solution  Commonly known as:  TYLENOL  Take 325 mg by mouth 4 (four) times daily.     bisacodyl 5 MG EC tablet  Commonly known as:  DULCOLAX  Take 10 mg by mouth at bedtime.     demeclocycline 300 MG tablet  Commonly known as:  DECLOMYCIN  Take 300 mg by mouth 3 (three) times daily.     ferrous sulfate 220 (44 FE) MG/5ML solution  Take 330 mg by mouth 3 (three) times daily. Take 7..5 ml by mouth three times daily     fludrocortisone 0.1 MG tablet  Commonly known as:  FLORINEF  Take 0.1 mg by mouth daily.     folic acid 1 MG tablet  Commonly known as:  FOLVITE  Take 1 mg by mouth daily.     levothyroxine 150 MCG tablet  Commonly known as:  SYNTHROID, LEVOTHROID  Take 150 mcg by mouth daily before breakfast.     magnesium hydroxide 400 MG/5ML suspension  Commonly known as:  MILK OF MAGNESIA  Take 30 mLs by mouth every 4 (four) hours as needed for constipation.     magnesium oxide 400 MG tablet  Commonly known as:  MAG-OX  Take 400 mg by mouth daily.     meloxicam 15 MG tablet  Commonly known as:  MOBIC  Take  15 mg by mouth daily.     omeprazole 20 MG capsule  Commonly known as:  PRILOSEC  Take 20 mg by mouth daily.     ondansetron 4 MG tablet  Commonly known as:  ZOFRAN  Take 4 mg by mouth every 8 (eight) hours as needed. For nausea     polyethylene glycol powder powder  Commonly known as:  GLYCOLAX/MIRALAX  Take 17 g by mouth daily.     potassium chloride 20 MEQ/15ML (10%) Soln  Take 20 mEq by mouth daily. *Dilute one tablespoon (15ml) in 3 ounces of water and drink daily*     sodium chloride irrigation 0.9 % irrigation  Irrigate with as directed 3 (three) times daily. Used for trache irrigation     THERAVITE PO  Take 1 tablet by mouth daily.     thiamine 100 MG tablet  Commonly known as:  VITAMIN B-1  Take 50 mg by mouth daily.     triamcinolone cream  0.1 %  Commonly known as:  KENALOG  Apply 1 application topically daily. To affected areas     vitamin C 500 MG tablet  Commonly known as:  ASCORBIC ACID  Take 1,000 mg by mouth 2 (two) times daily.     Vitamin D 2000 UNITS Caps  Take 1 capsule by mouth daily.         Janne Napoleon, [place] 02/20/13 [phone]

## 2013-02-21 NOTE — ED Provider Notes (Signed)
Medical screening examination/treatment/procedure(s) were conducted as a shared visit with non-physician practitioner(s) and myself.  I personally evaluated the patient during the encounter  64M with extensive medical history presents with neck pain. No trauma. Neck pain on R side of neck, radiating down arm. Arm strong, sensation normal. Muscular pain on palpation of R shoulder, arm. Treat with robaxin qhs, already on mobic.   Dagmar Hait, MD 02/21/13 [phone]

## 2013-09-04 ENCOUNTER — Emergency Department (HOSPITAL_COMMUNITY)
Admission: EM | Admit: 2013-09-04 | Discharge: 2013-09-05 | Disposition: A | Discharge status: Other Acute Inpatient Hospital | Payer: Medicaid Other | Attending: Emergency Medicine | Admitting: Emergency Medicine

## 2013-09-04 ENCOUNTER — Encounter (HOSPITAL_COMMUNITY): Payer: Self-pay | Admitting: Emergency Medicine

## 2013-09-04 DIAGNOSIS — Z87891 Personal history of nicotine dependence: Secondary | ICD-10-CM | POA: Insufficient documentation

## 2013-09-04 DIAGNOSIS — Z930 Tracheostomy status: Secondary | ICD-10-CM | POA: Insufficient documentation

## 2013-09-04 DIAGNOSIS — Z79899 Other long term (current) drug therapy: Secondary | ICD-10-CM | POA: Insufficient documentation

## 2013-09-04 DIAGNOSIS — Z9889 Other specified postprocedural states: Secondary | ICD-10-CM | POA: Insufficient documentation

## 2013-09-04 DIAGNOSIS — E079 Disorder of thyroid, unspecified: Secondary | ICD-10-CM | POA: Insufficient documentation

## 2013-09-04 DIAGNOSIS — Z791 Long term (current) use of non-steroidal anti-inflammatories (NSAID): Secondary | ICD-10-CM | POA: Insufficient documentation

## 2013-09-04 DIAGNOSIS — I10 Essential (primary) hypertension: Secondary | ICD-10-CM | POA: Insufficient documentation

## 2013-09-04 DIAGNOSIS — C760 Malignant neoplasm of head, face and neck: Secondary | ICD-10-CM

## 2013-09-04 DIAGNOSIS — R1310 Dysphagia, unspecified: Secondary | ICD-10-CM | POA: Insufficient documentation

## 2013-09-04 DIAGNOSIS — Z792 Long term (current) use of antibiotics: Secondary | ICD-10-CM | POA: Insufficient documentation

## 2013-09-04 DIAGNOSIS — C140 Malignant neoplasm of pharynx, unspecified: Secondary | ICD-10-CM

## 2013-09-04 DIAGNOSIS — K219 Gastro-esophageal reflux disease without esophagitis: Secondary | ICD-10-CM | POA: Insufficient documentation

## 2013-09-04 HISTORY — DX: Dysphagia, unspecified: R13.10

## 2013-09-04 HISTORY — DX: Hypokalemia: E87.6

## 2013-09-04 HISTORY — DX: Hypo-osmolality and hyponatremia: E87.1

## 2013-09-04 LAB — CBC WITH DIFFERENTIAL/PLATELET
BASOS PCT: 1 % (ref 0–1)
Basophils Absolute: 0.1 10*3/uL (ref 0.0–0.1)
Eosinophils Absolute: 0.3 10*3/uL (ref 0.0–0.7)
Eosinophils Relative: 2 % (ref 0–5)
HEMATOCRIT: 31.3 % — AB (ref 39.0–52.0)
HEMOGLOBIN: 11.5 g/dL — AB (ref 13.0–17.0)
LYMPHS PCT: 9 % — AB (ref 12–46)
Lymphs Abs: 1.3 10*3/uL (ref 0.7–4.0)
MCH: 30.5 pg (ref 26.0–34.0)
MCHC: 36.7 g/dL — ABNORMAL HIGH (ref 30.0–36.0)
MCV: 83 fL (ref 78.0–100.0)
MONO ABS: 0.9 10*3/uL (ref 0.1–1.0)
Monocytes Relative: 6 % (ref 3–12)
NEUTROS ABS: 11.5 10*3/uL — AB (ref 1.7–7.7)
Neutrophils Relative %: 82 % — ABNORMAL HIGH (ref 43–77)
Platelets: 291 10*3/uL (ref 150–400)
RBC: 3.77 MIL/uL — ABNORMAL LOW (ref 4.22–5.81)
RDW: 12.5 % (ref 11.5–15.5)
WBC: 14.1 10*3/uL — ABNORMAL HIGH (ref 4.0–10.5)

## 2013-09-04 LAB — COMPREHENSIVE METABOLIC PANEL
ALT: 16 U/L (ref 0–53)
AST: 22 U/L (ref 0–37)
Albumin: 3.1 g/dL — ABNORMAL LOW (ref 3.5–5.2)
Alkaline Phosphatase: 139 U/L — ABNORMAL HIGH (ref 39–117)
BILIRUBIN TOTAL: 0.6 mg/dL (ref 0.3–1.2)
BUN: 13 mg/dL (ref 6–23)
CHLORIDE: 87 meq/L — AB (ref 96–112)
CO2: 25 meq/L (ref 19–32)
CREATININE: 0.58 mg/dL (ref 0.50–1.35)
Calcium: 8.6 mg/dL (ref 8.4–10.5)
GFR calc Af Amer: >90 mL/min (ref >90)
Glucose, Bld: 101 mg/dL — ABNORMAL HIGH (ref 70–99)
Potassium: 4.5 mEq/L (ref 3.7–5.3)
Sodium: 122 mEq/L — ABNORMAL LOW (ref 137–147)
Total Protein: 6.8 g/dL (ref 6.0–8.3)

## 2013-09-04 LAB — CBG MONITORING, ED: GLUCOSE-CAPILLARY: 81 mg/dL (ref 70–99)

## 2013-09-04 MED ORDER — SODIUM CHLORIDE 0.9 % IV SOLN
INTRAVENOUS | Status: DC
  Administered 2013-09-04: 21:00:00 via INTRAVENOUS

## 2013-09-04 MED ORDER — GLUCAGON HCL (RDNA) 1 MG IJ SOLR
1.0000 mg | INTRAMUSCULAR | Status: AC | PRN
  Administered 2013-09-04 (×2): 1 mg via INTRAVENOUS
  Filled 2013-09-04 (×2): qty 1

## 2013-09-04 NOTE — ED Provider Notes (Signed)
CSN: [id number]     Arrival date & time 09/04/13  1855 History   First MD Initiated Contact with Patient 09/04/13 1938     Chief Complaint  Patient presents with  . unable to swallow      (Consider location/radiation/quality/duration/timing/severity/associated sxs/prior Treatment) HPI   Justin Wilcox is a 63 y.o. male was reportedly sent her, from his nursing care facility, for her swallowing problems. It is unknown when this started. The patient cannot talk because he has had a tracheostomy, and apparently a laryngectomy. He is able to indicate that he has trouble swallowing both liquids and solids. He denies problems breathing. In emergency department he is producing clear sputum into the emesis basin.  Level 5 caveat- unable to speak  Past Medical History  Diagnosis Date  . Cancer   . Hypertension   . GERD (gastroesophageal reflux disease)   . Thyroid disease   . Tracheostomy status   . Hyponatremia   . Hypokalemia   . Dysphagia    Past Surgical History  Procedure Laterality Date  . Throat surgery      for cancer  . Leg surgery    . Tracheostomy     No family history on file. History  Substance Use Topics  . Smoking status: Former Research scientist (life sciences)  . Smokeless tobacco: Not on file  . Alcohol Use: No    Review of Systems  Unable to perform ROS     Allergies  Review of patient's allergies indicates no known allergies.  Home Medications   Prior to Admission medications   Medication Sig Start Date End Date Taking? Authorizing Provider  acetaminophen (TYLENOL) 325 MG tablet Take 650 mg by mouth every 6 (six) hours.   Yes Historical Provider, MD  bisacodyl (DULCOLAX) 5 MG EC tablet Take 10 mg by mouth at bedtime.    Yes Historical Provider, MD  demeclocycline (DECLOMYCIN) 300 MG tablet Take 300 mg by mouth 2 (two) times daily.    Yes Historical Provider, MD  Ergocalciferol (VITAMIN D2) 2000 UNITS TABS Take 1 capsule by mouth daily.   Yes Historical Provider, MD   ferrous sulfate 220 (44 FE) MG/5ML solution Take 330 mg by mouth 3 (three) times daily.    Yes Historical Provider, MD  levothyroxine (SYNTHROID, LEVOTHROID) 125 MCG tablet Take 125 mcg by mouth daily before breakfast.   Yes Historical Provider, MD  magnesium oxide (MAG-OX) 400 MG tablet Take 400 mg by mouth daily.     Yes Historical Provider, MD  meloxicam (MOBIC) 15 MG tablet Take 15 mg by mouth daily.     Yes Historical Provider, MD  Multiple Vitamin (THERAVITE PO) Take 1 tablet by mouth daily.     Yes Historical Provider, MD  omeprazole (PRILOSEC) 20 MG capsule Take 20 mg by mouth daily.     Yes Historical Provider, MD  polyethylene glycol powder (GLYCOLAX/MIRALAX) powder Take 17 g by mouth daily.    Yes Historical Provider, MD  sodium chloride 1 G tablet Take 1 g by mouth 3 (three) times daily.   Yes Historical Provider, MD  thiamine (VITAMIN B-1) 100 MG tablet Take 50 mg by mouth daily.     Yes Historical Provider, MD  triamcinolone cream (KENALOG) 0.1 % Apply 1 application topically daily. To affected areas    Yes Historical Provider, MD  vitamin C (ASCORBIC ACID) 500 MG tablet Take 500 mg by mouth 2 (two) times daily.    Yes Historical Provider, MD  folic acid (FOLVITE) 1 MG  tablet Take 1 mg by mouth daily.    Historical Provider, MD  ondansetron (ZOFRAN) 4 MG tablet Take 4 mg by mouth every 8 (eight) hours as needed. For nausea    Historical Provider, MD   BP 153/73  Pulse 77  Temp(Src) 100.7 F (38.2 C) (Oral)  Resp 16  SpO2 100% Physical Exam  Nursing note and vitals reviewed. Constitutional: He appears well-developed and well-nourished.  HENT:  Head: Normocephalic and atraumatic.  Right Ear: External ear normal.  Left Ear: External ear normal.  Eyes: Conjunctivae and EOM are normal. Pupils are equal, round, and reactive to light.  Neck: Normal range of motion and phonation normal. Neck supple.  Cardiovascular: Normal rate, regular rhythm, normal heart sounds and intact  distal pulses.   Pulmonary/Chest: Effort normal and breath sounds normal. No respiratory distress. He has no wheezes. He has no rales. He exhibits no tenderness and no bony tenderness.  Tracheostomy lower anterior neck, site appears, normal. There is no drainage or discharge. No stridor.   Abdominal: Soft. There is no tenderness.  Musculoskeletal: Normal range of motion.  Neurological: He is alert. No cranial nerve deficit or sensory deficit. He exhibits normal muscle tone. Coordination normal.  Skin: Skin is warm, dry and intact.  Psychiatric: He has a normal mood and affect. His behavior is normal.    ED Course  Procedures (including critical care time)  20:00 - Trial of sipping water in the emergency department- caused pain and he refused to swallow more. He wrote on paper that the problem started last night when he was taking his night-time medication. He takes them crushed. He indicates that he eats normal consistency foods and fluids.  Medications  0.9 %  sodium chloride infusion (not administered)  glucagon (GLUCAGEN) injection 1 mg (not administered)    Patient Vitals for the past 24 hrs:  BP Temp Temp src Pulse Resp SpO2  09/04/13 1858 153/73 mmHg 100.7 F (38.2 C) Oral 77 16 100 %    21:35 PM Reevaluation with update and discussion. After initial assessment and treatment, an updated evaluation reveals that he is still unable to swallow, without pain. He does not appear to be spitting saliva. Richarda Blade   21:45- were contacted by a regular at his group home, who states that the doctor in charge of his facility, contacted the patient's ENT physician at [facility]. The doctor was told the physicians there felt that he should be transferred there for evaluation. 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 This data is visible in Care Everywhere. I will attempt to contact them to discuss the case, and possibly  arrange transfer.  22:10-I discussed the situation with the on-call ENT physician at [facility]. He suggests placing a Bophof NG tube for nutrition and medication  22:30- Pt's Group Home cannot manage a pt.who cannot eat and swallow medications.   Labs Review Labs Reviewed - No data to display  Imaging Review No results found.   EKG Interpretation None      MDM   Final diagnoses:  Head and neck cancer  Swallowing disorder    Painful swallowing with progressing head and neck cancer. Unable to improve sx in ED.   Nursing Notes Reviewed/ Care Coordinated Applicable Imaging Reviewed Interpretation of Laboratory Data incorporated into ED treatment  22:45- Care to Dr. Deborha Payment, MD 09/04/13 2329

## 2013-09-04 NOTE — ED Notes (Signed)
R chest port accessed by T. Linna Darner, RN.

## 2013-09-04 NOTE — ED Notes (Signed)
Pt resident at group home - per EMS staff reports pt has hx of throat ca with difficulty swallowing.  Denies difficulty breathing.

## 2013-09-04 NOTE — ED Provider Notes (Signed)
I spoke with the hospitalist here and it was [facility]  Dr. Elwyn Reach accepted the pt    Maudry Diego, MD 09/04/13 [phone]

## 2013-09-04 NOTE — ED Notes (Signed)
Pt resting.

## 2013-09-05 MED ORDER — HYDROMORPHONE HCL PF 1 MG/ML IJ SOLN
1.0000 mg | Freq: Once | INTRAMUSCULAR | Status: AC
  Administered 2013-09-05: 1 mg via INTRAVENOUS

## 2013-09-05 MED ORDER — ONDANSETRON HCL 4 MG/2ML IJ SOLN
4.0000 mg | Freq: Once | INTRAMUSCULAR | Status: AC
  Administered 2013-09-05: 4 mg via INTRAVENOUS

## 2013-09-05 MED ORDER — HYDROMORPHONE HCL PF 1 MG/ML IJ SOLN
INTRAMUSCULAR | Status: AC
  Filled 2013-09-05: qty 1

## 2013-09-05 MED ORDER — ONDANSETRON HCL 4 MG/2ML IJ SOLN
INTRAMUSCULAR | Status: AC
  Filled 2013-09-05: qty 2

## 2013-09-05 NOTE — ED Notes (Addendum)
PT IS A RESIDENT OF [facility]. CARET[facility] AT [place] [facility] # IS [phone]

## 2014-01-05 IMAGING — CR DG CHEST 2V
2 series · 2 of 2 positions shown · non-contrast
Comparison: 10/24/2011

CLINICAL DATA: Cough

CHEST - 2 VIEW

[view not recorded (1 of 2)]
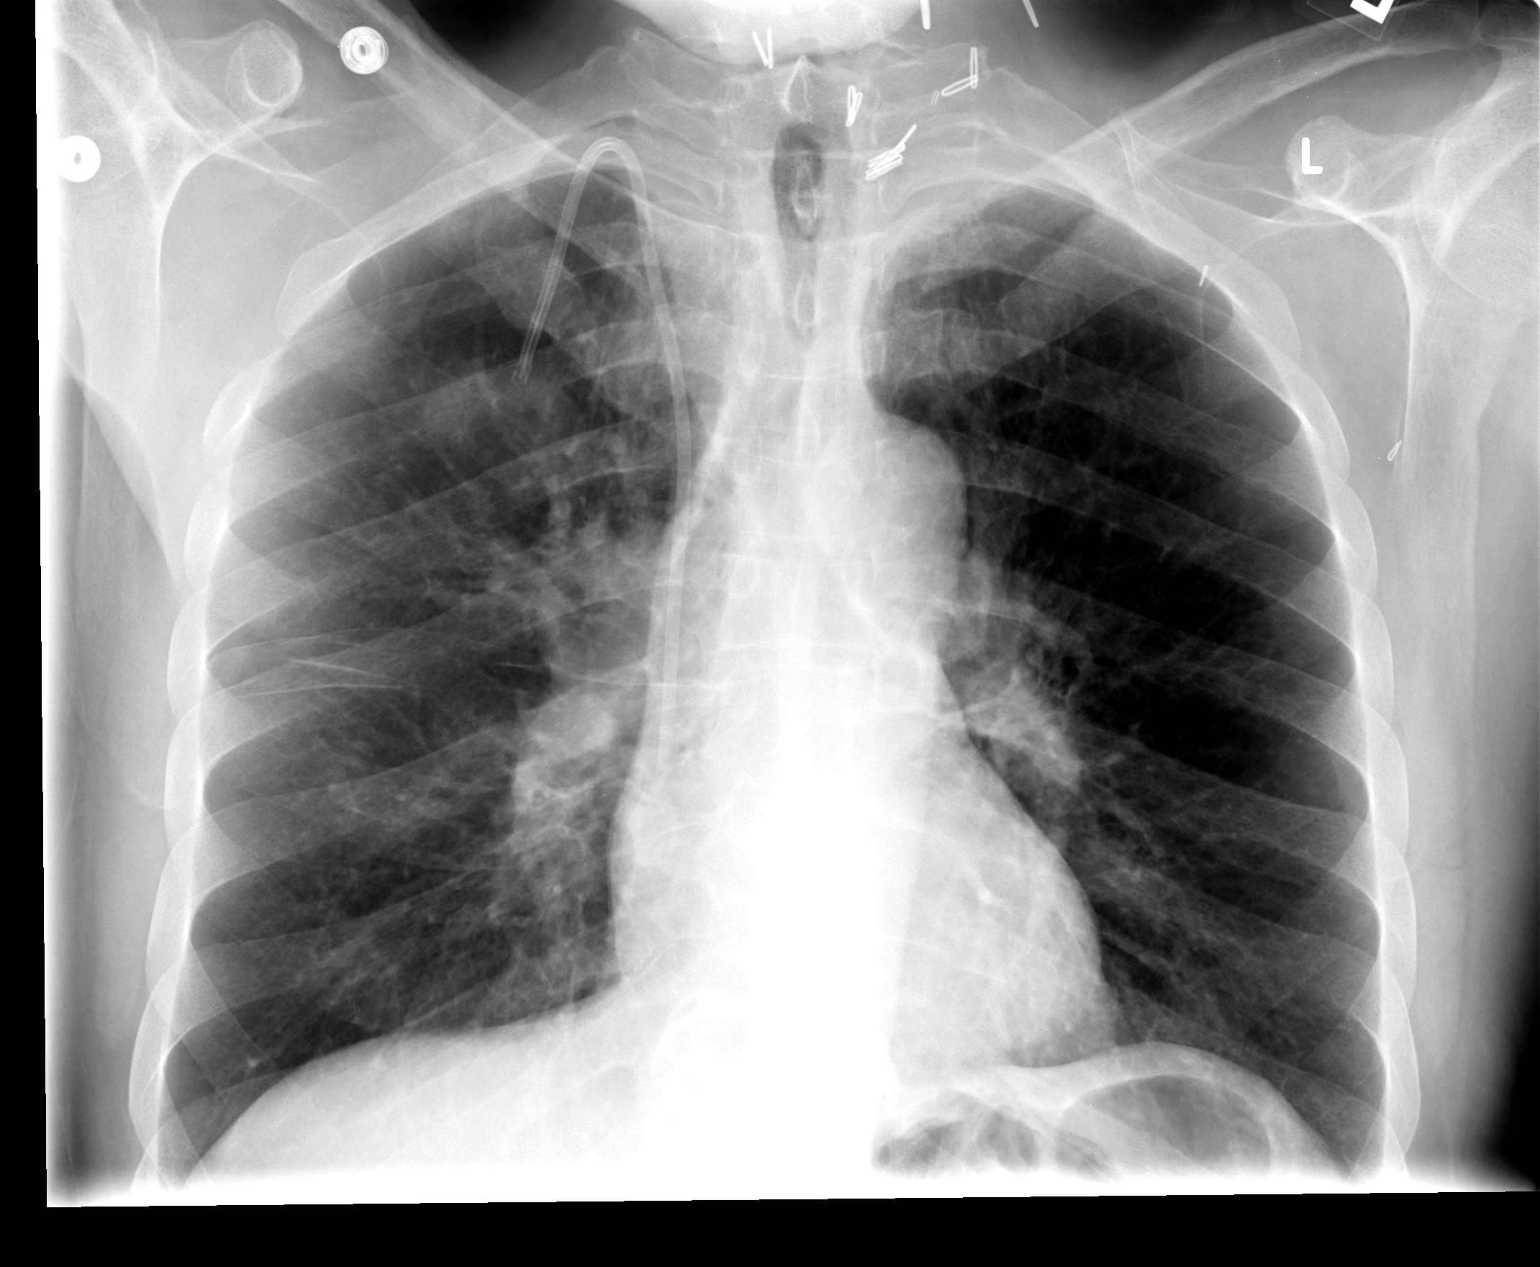

[view not recorded (2 of 2)]
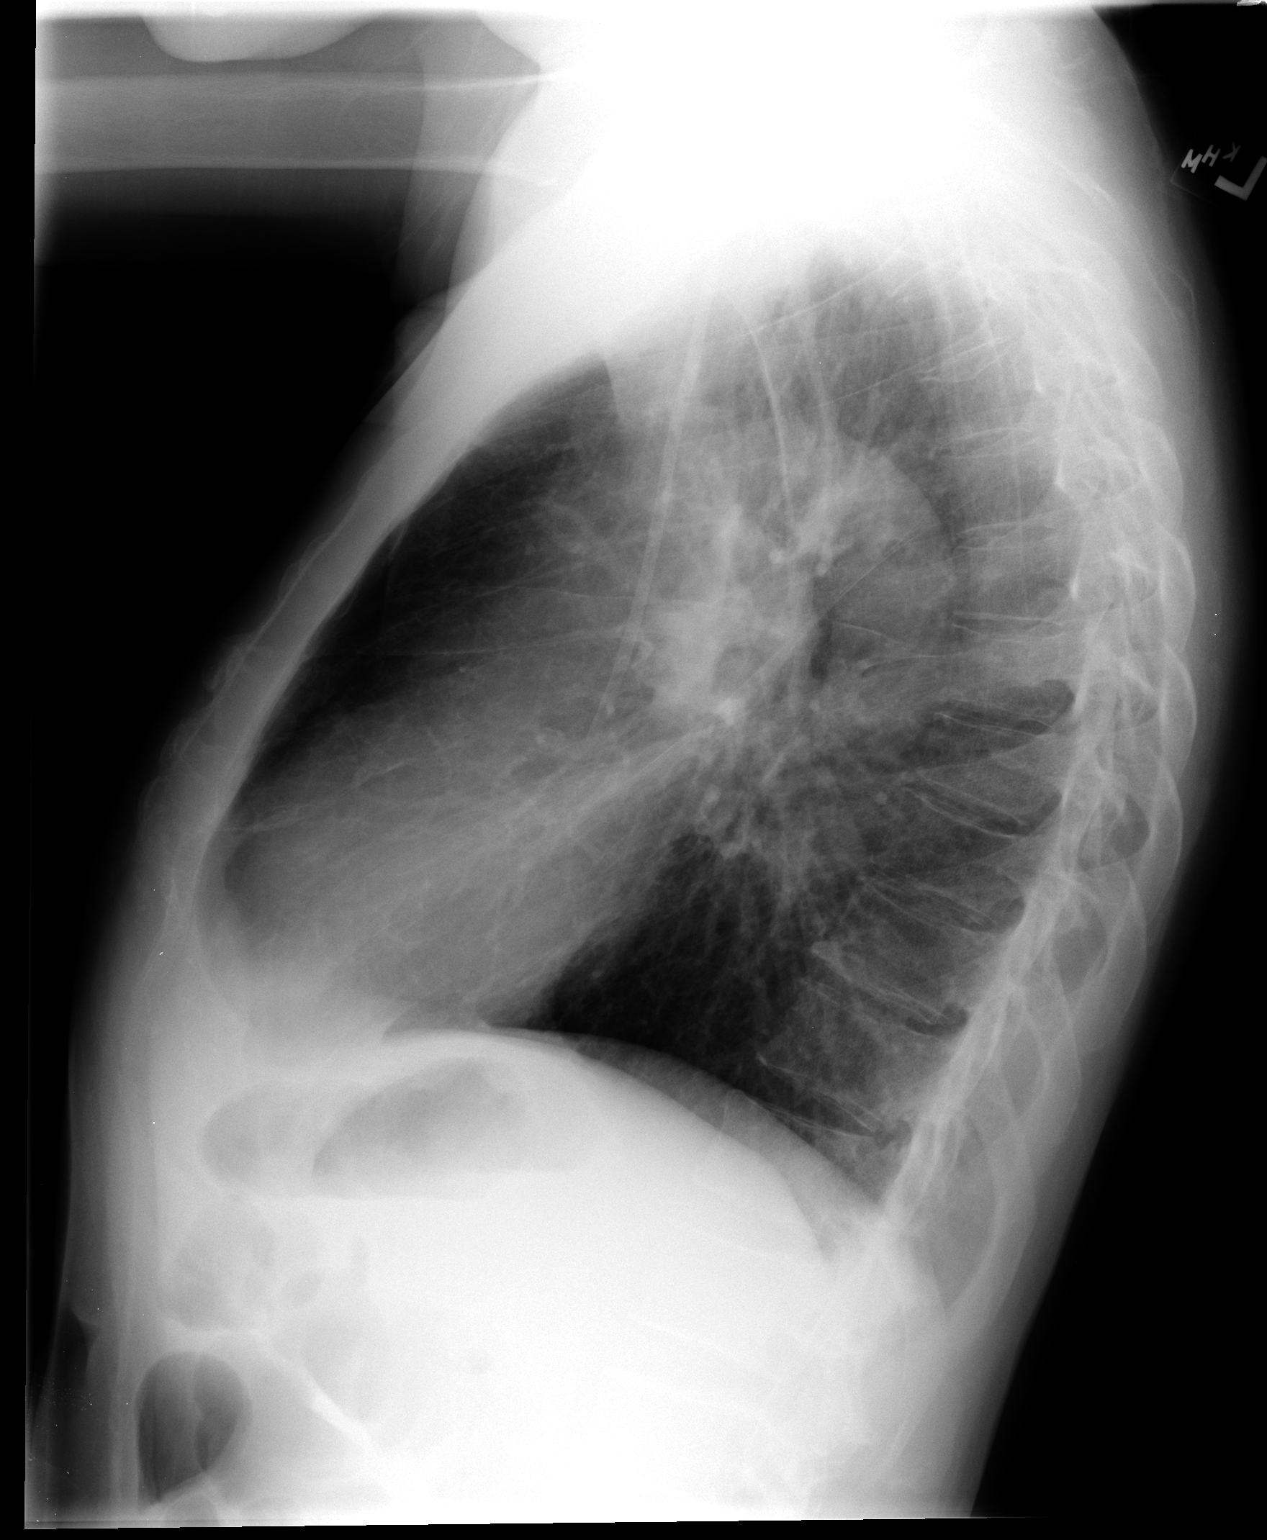

[2 of 2 positions shown; findings below may reference images not displayed]

FINDINGS: The cardiomediastinal silhouette is stable.  Right IJ
Port-A-Cath is unchanged in position.  No acute infiltrate or
pulmonary edema.  Surgical clips within the soft tissue neck again
noted.
IMPRESSION: No active disease.  No significant change.

## 2014-10-12 DEATH — deceased
